# Patient Record
Sex: Female | Born: 1950 | Smoking: Former smoker
Health system: Southern US, Community
[De-identification: ages and names within clinical notes are randomized; demographics above are authoritative.]

## PROBLEM LIST (undated history)

## (undated) DIAGNOSIS — E039 Hypothyroidism, unspecified: Secondary | ICD-10-CM

## (undated) DIAGNOSIS — M1990 Unspecified osteoarthritis, unspecified site: Secondary | ICD-10-CM

## (undated) DIAGNOSIS — M069 Rheumatoid arthritis, unspecified: Secondary | ICD-10-CM

## (undated) DIAGNOSIS — H409 Unspecified glaucoma: Secondary | ICD-10-CM

## (undated) DIAGNOSIS — D509 Iron deficiency anemia, unspecified: Secondary | ICD-10-CM

## (undated) DIAGNOSIS — G4733 Obstructive sleep apnea (adult) (pediatric): Secondary | ICD-10-CM

## (undated) DIAGNOSIS — I10 Essential (primary) hypertension: Secondary | ICD-10-CM

## (undated) DIAGNOSIS — K589 Irritable bowel syndrome without diarrhea: Secondary | ICD-10-CM

## (undated) DIAGNOSIS — J45909 Unspecified asthma, uncomplicated: Secondary | ICD-10-CM

## (undated) DIAGNOSIS — Z9989 Dependence on other enabling machines and devices: Secondary | ICD-10-CM

## (undated) HISTORY — PX: LAPAROSCOPIC CHOLECYSTECTOMY: SUR755

## (undated) HISTORY — PX: ABDOMINAL HYSTERECTOMY: SHX81

---

## 1981-05-04 HISTORY — PX: THYROIDECTOMY, PARTIAL: SHX18

## 2002-05-04 HISTORY — PX: BREAST BIOPSY: SHX20

## 2017-05-04 HISTORY — PX: CATARACT EXTRACTION W/ INTRAOCULAR LENS  IMPLANT, BILATERAL: SHX1307

## 2018-06-22 ENCOUNTER — Encounter: Payer: Self-pay | Admitting: Student

## 2018-06-22 NOTE — H&P (Signed)
TOTAL KNEE ADMISSION H&P  Patient is being admitted for right total knee arthroplasty.  Subjective:  Chief Complaint:right knee pain.  HPI: Teresa Barajas, 68 y.o. female, has a history of pain and functional disability in the right knee due to arthritis and has failed non-surgical conservative treatments for greater than 12 weeks to includecorticosteriod injections, viscosupplementation injections and activity modification.  Onset of symptoms was gradual, starting 7 years ago with gradually worsening course since that time. The patient noted no past surgery on the right knee(s).  Patient currently rates pain in the right knee(s) at 10 out of 10 with activity. Patient has worsening of pain with activity and weight bearing and crepitus.  Patient has evidence of severe tri-compartmental bone-on-bone arthritis by imaging studies. There is no active infection.  There are no active problems to display for this patient.  History reviewed. No pertinent past medical history.  History reviewed. No pertinent surgical history.  No current facility-administered medications for this encounter.    No current outpatient medications on file.   Allergies not on file  Social History   Tobacco Use  . Smoking status: Not on file  Substance Use Topics  . Alcohol use: Not on file    History reviewed. No pertinent family history.   Review of Systems  Constitutional: Negative for chills and fever.  HENT: Negative for congestion, sore throat and tinnitus.   Eyes: Negative for double vision, photophobia and pain.  Respiratory: Negative for cough, shortness of breath and wheezing.   Cardiovascular: Negative for chest pain, palpitations and orthopnea.  Gastrointestinal: Negative for heartburn, nausea and vomiting.  Genitourinary: Negative for dysuria, frequency and urgency.  Musculoskeletal: Positive for joint pain.  Neurological: Negative for dizziness, weakness and headaches.    Objective:  Physical Exam    Well nourished and well developed.  General: Alert and oriented x3, cooperative and pleasant, no acute distress.  Head: normocephalic, atraumatic, neck supple.  Eyes: EOMI.  Respiratory: breath sounds clear in all fields, no wheezing, rales, or rhonchi. Cardiovascular: Regular rate and rhythm, no murmurs, gallops or rubs.  Abdomen: non-tender to palpation and soft, normoactive bowel sounds. Musculoskeletal:  Right Knee Exam: No effusion. No Swelling. Range of motion is 0-100 degrees with a firm endpoint.  Marked crepitus on range of motion of the knee.  Positive medial and lateral joint line tenderness.  Slight varus-valgus laxity.   Calves soft and nontender. Motor function intact in LE. Strength 5/5 LE bilaterally. Neuro: Distal pulses 2+. Sensation to light touch intact in LE.  Vital signs in last 24 hours: Blood pressure: 140/84 mmHg  Imaging Review Plain radiographs demonstrate severe degenerative joint disease of the right knee(s). The overall alignment isneutral. The bone quality appears to be adequate for age and reported activity level.      Assessment/Plan:  End stage arthritis, right knee   The patient history, physical examination, clinical judgment of the provider and imaging studies are consistent with end stage degenerative joint disease of the right knee(s) and total knee arthroplasty is deemed medically necessary. The treatment options including medical management, injection therapy arthroscopy and arthroplasty were discussed at length. The risks and benefits of total knee arthroplasty were presented and reviewed. The risks due to aseptic loosening, infection, stiffness, patella tracking problems, thromboembolic complications and other imponderables were discussed. The patient acknowledged the explanation, agreed to proceed with the plan and consent was signed. Patient is being admitted for inpatient treatment for surgery, pain control, PT, OT, prophylactic  antibiotics, VTE  prophylaxis, progressive ambulation and ADL's and discharge planning. The patient is planning to be discharged home.    Anticipated LOS equal to or greater than 2 midnights due to - Age 92 and older with one or more of the following:  - Obesity  - Expected need for hospital services (PT, OT, Nursing) required for safe  discharge  - Anticipated need for postoperative skilled nursing care or inpatient rehab  - Active co-morbidities: None OR   - Unanticipated findings during/Post Surgery: None  - Patient is a high risk of re-admission due to: None  Therapy Plans: outpatient therapy at [facility] Disposition: Home with husband and sister Planned DVT Prophylaxis: Aspirin 325 mg BID DME needed: Dan Humphreys, 3-in-1 PCP: Truddie Coco, FNP TXA: IV Allergies: Latex (rash), adhesives Anesthesia Concerns: None BMI: 38.3  - Patient was instructed on what medications to stop prior to surgery. - Follow-up visit in 2 weeks with Dr. Lequita Halt - Begin physical therapy following surgery - Pre-operative lab work as pre-surgical testing - Prescriptions will be provided in hospital at time of discharge  Arther Abbott, PA-C Orthopedic Surgery [facility] [place]

## 2018-07-04 ENCOUNTER — Encounter (HOSPITAL_COMMUNITY): Payer: Self-pay

## 2018-07-04 ENCOUNTER — Encounter (HOSPITAL_COMMUNITY)
Admission: RE | Admit: 2018-07-04 | Discharge: 2018-07-04 | Disposition: A | Discharge status: Home / Self Care | Payer: Medicare Other | Source: Ambulatory Visit | Attending: Orthopedic Surgery | Admitting: Orthopedic Surgery

## 2018-07-04 ENCOUNTER — Other Ambulatory Visit: Payer: Self-pay

## 2018-07-04 DIAGNOSIS — Z01812 Encounter for preprocedural laboratory examination: Secondary | ICD-10-CM | POA: Insufficient documentation

## 2018-07-04 HISTORY — DX: Dependence on other enabling machines and devices: Z99.89

## 2018-07-04 HISTORY — DX: Unspecified glaucoma: H40.9

## 2018-07-04 HISTORY — DX: Unspecified osteoarthritis, unspecified site: M19.90

## 2018-07-04 HISTORY — DX: Rheumatoid arthritis, unspecified: M06.9

## 2018-07-04 HISTORY — DX: Irritable bowel syndrome, unspecified: K58.9

## 2018-07-04 HISTORY — DX: Iron deficiency anemia, unspecified: D50.9

## 2018-07-04 HISTORY — DX: Hypothyroidism, unspecified: E03.9

## 2018-07-04 HISTORY — DX: Essential (primary) hypertension: I10

## 2018-07-04 HISTORY — DX: Obstructive sleep apnea (adult) (pediatric): G47.33

## 2018-07-04 HISTORY — DX: Unspecified asthma, uncomplicated: J45.909

## 2018-07-04 LAB — CBC
HCT: 37.6 % (ref 36.0–46.0)
HEMOGLOBIN: 12.4 g/dL (ref 12.0–15.0)
MCH: 32.7 pg (ref 26.0–34.0)
MCHC: 33 g/dL (ref 30.0–36.0)
MCV: 99.2 fL (ref 80.0–100.0)
Platelets: 230 10*3/uL (ref 150–400)
RBC: 3.79 MIL/uL — ABNORMAL LOW (ref 3.87–5.11)
RDW: 12.7 % (ref 11.5–15.5)
WBC: 5.6 10*3/uL (ref 4.0–10.5)
nRBC: 0 % (ref 0.0–0.2)

## 2018-07-04 LAB — COMPREHENSIVE METABOLIC PANEL
ALT: 19 U/L (ref 0–44)
AST: 22 U/L (ref 15–41)
Albumin: 4.4 g/dL (ref 3.5–5.0)
Alkaline Phosphatase: 53 U/L (ref 38–126)
Anion gap: 9 (ref 5–15)
BUN: 16 mg/dL (ref 8–23)
CO2: 27 mmol/L (ref 22–32)
Calcium: 9.1 mg/dL (ref 8.9–10.3)
Chloride: 103 mmol/L (ref 98–111)
Creatinine, Ser: 0.6 mg/dL (ref 0.44–1.00)
GFR calc Af Amer: >60 mL/min (ref >60)
GFR calc non Af Amer: >60 mL/min (ref >60)
Glucose, Bld: 103 mg/dL — ABNORMAL HIGH (ref 70–99)
Potassium: 3.6 mmol/L (ref 3.5–5.1)
Sodium: 139 mmol/L (ref 135–145)
Total Bilirubin: 0.4 mg/dL (ref 0.3–1.2)
Total Protein: 7.2 g/dL (ref 6.5–8.1)

## 2018-07-04 LAB — PROTIME-INR
INR: 1 (ref 0.8–1.2)
Prothrombin Time: 12.6 seconds (ref 11.4–15.2)

## 2018-07-04 LAB — ABO/RH: ABO/RH(D): A POS

## 2018-07-04 LAB — SURGICAL PCR SCREEN
MRSA, PCR: NEGATIVE
Staphylococcus aureus: NEGATIVE

## 2018-07-04 LAB — APTT: APTT: 48 s — AB (ref 24–36)

## 2018-07-04 NOTE — Progress Notes (Addendum)
Pt PCP surgical clearance , karen mcclure FNP, dated 06-23-2018 with chart.  Also, had current EKG dated 06-23-2018 with chart.   Per pt last dose plaquenil 06-26-2018 and last cimzia infusion 06-27-2018.  ADDENDUM:  Called and lvm for kelly , or scheduler for dr Lequita Halt, requested pt's pulmonologist clearance faxed.  Chart given to anesthesia for review, Jodell Cipro PA.

## 2018-07-04 NOTE — Patient Instructions (Addendum)
Teresa Barajas  07/04/2018   Your procedure is scheduled on:  07-11-2018     Report to [facility] Main  Entrance,  Report to admitting at  8:45 AM    Call this number if you have problems the morning of surgery [phone]       Remember: Do not eat food or drink liquids :After Midnight.  This includes no water, candy, gum, mints.  BRUSH YOUR TEETH MORNING OF SURGERY AND RINSE YOUR MOUTH OUT         Take these medicines the morning of surgery with A SIP OF WATER:  Duloxetine (cymbalta),  Gabapentin,  Levothyroxine (synthroid),  Dicyclomine (bentyl), hydrocodone if needed   and if need Proventil inhaler and bring inhaler with you day of surgery.                                   You may not have any metal on your body including hair pins and               piercings  Do not wear jewelry, make-up, lotions, powders or perfumes, deodorant              Do not wear nail polish.  Do not shave  48 hours prior to surgery.                 Do not bring valuables to the hospital. [facility] IS NOT             RESPONSIBLE   FOR VALUABLES.  Contacts, dentures or bridgework may not be worn into surgery.  Leave suitcase in the car. After surgery it may be brought to your room.   _____________________________________________________________________           [facility] - Preparing for Surgery Before surgery, you can play an important role.  Because skin is not sterile, your skin needs to be as free of germs as possible.  You can reduce the number of germs on your skin by washing with CHG (chlorahexidine gluconate) soap before surgery.  CHG is an antiseptic cleaner which kills germs and bonds with the skin to continue killing germs even after washing. Please DO NOT use if you have an allergy to CHG or antibacterial soaps.  If your skin becomes reddened/irritated stop using the CHG and inform your nurse when you arrive at Short Stay. Do not shave (including legs  and underarms) for at least 48 hours prior to the first CHG shower.  You may shave your face/neck. Please follow these instructions carefully:  1.  Shower with CHG Soap the night before surgery and the  morning of Surgery.  2.  If you choose to wash your hair, wash your hair first as usual with your  normal  shampoo.  3.  After you shampoo, rinse your hair and body thoroughly to remove the  shampoo.                            4.  Use CHG as you would any other liquid soap.  You can apply chg directly  to the skin and wash                       Gently with a scrungie or  clean washcloth.  5.  Apply the CHG Soap to your body ONLY FROM THE NECK DOWN.   Do not use on face/ open                           Wound or open sores. Avoid contact with eyes, ears mouth and genitals (private parts).                       Wash face,  Genitals (private parts) with your normal soap.             6.  Wash thoroughly, paying special attention to the area where your surgery  will be performed.  7.  Thoroughly rinse your body with warm water from the neck down.  8.  DO NOT shower/wash with your normal soap after using and rinsing off  the CHG Soap.             9.  Pat yourself dry with a clean towel.            10.  Wear clean pajamas.            11.  Place clean sheets on your bed the night of your first shower and do not  sleep with pets. Day of Surgery : Do not apply any lotions/deodorants the morning of surgery.  Please wear clean clothes to the hospital/surgery center.  FAILURE TO FOLLOW THESE INSTRUCTIONS MAY RESULT IN THE CANCELLATION OF YOUR SURGERY PATIENT SIGNATURE_________________________________  NURSE SIGNATURE__________________________________  ________________________________________________________________________   Rogelia MireIncentive Spirometer  An incentive spirometer is a tool that can help keep your lungs clear and active. This tool measures how well you are filling your lungs with each breath.  Taking long deep breaths may help reverse or decrease the chance of developing breathing (pulmonary) problems (especially infection) following:  A long period of time when you are unable to move or be active. BEFORE THE PROCEDURE   If the spirometer includes an indicator to show your best effort, your nurse or respiratory therapist will set it to a desired goal.  If possible, sit up straight or lean slightly forward. Try not to slouch.  Hold the incentive spirometer in an upright position. INSTRUCTIONS FOR USE  1. Sit on the edge of your bed if possible, or sit up as far as you can in bed or on a chair. 2. Hold the incentive spirometer in an upright position. 3. Breathe out normally. 4. Place the mouthpiece in your mouth and seal your lips tightly around it. 5. Breathe in slowly and as deeply as possible, raising the piston or the ball toward the top of the column. 6. Hold your breath for 3-5 seconds or for as long as possible. Allow the piston or ball to fall to the bottom of the column. 7. Remove the mouthpiece from your mouth and breathe out normally. 8. Rest for a few seconds and repeat Steps 1 through 7 at least 10 times every 1-2 hours when you are awake. Take your time and take a few normal breaths between deep breaths. 9. The spirometer may include an indicator to show your best effort. Use the indicator as a goal to work toward during each repetition. 10. After each set of 10 deep breaths, practice coughing to be sure your lungs are clear. If you have an incision (the cut made at the time of surgery), support your incision when coughing by placing a  pillow or rolled up towels firmly against it. Once you are able to get out of bed, walk around indoors and cough well. You may stop using the incentive spirometer when instructed by your caregiver.  RISKS AND COMPLICATIONS  Take your time so you do not get dizzy or light-headed.  If you are in pain, you may need to take or ask for pain  medication before doing incentive spirometry. It is harder to take a deep breath if you are having pain. AFTER USE  Rest and breathe slowly and easily.  It can be helpful to keep track of a log of your progress. Your caregiver can provide you with a simple table to help with this. If you are using the spirometer at home, follow these instructions: [place] IF:   You are having difficultly using the spirometer.  You have trouble using the spirometer as often as instructed.  Your pain medication is not giving enough relief while using the spirometer.  You develop fever of 100.5 F (38.1 C) or higher. SEEK IMMEDIATE MEDICAL CARE IF:   You cough up bloody sputum that had not been present before.  You develop fever of 102 F (38.9 C) or greater.  You develop worsening pain at or near the incision site. MAKE SURE YOU:   Understand these instructions.  Will watch your condition.  Will get help right away if you are not doing well or get worse. Document Released: 08/31/2006 Document Revised: 07/13/2011 Document Reviewed: 11/01/2006 Exit[employer] Patient Information 2014 [employer], [place].   ________________________________________________________________________  WHAT IS A BLOOD TRANSFUSION? Blood Transfusion Information  A transfusion is the replacement of blood or some of its parts. Blood is made up of multiple cells which provide different functions.  Red blood cells carry oxygen and are used for blood loss replacement.  White blood cells fight against infection.  Platelets control bleeding.  Plasma helps clot blood.  Other blood products are available for specialized needs, such as hemophilia or other clotting disorders. BEFORE THE TRANSFUSION  Who gives blood for transfusions?   Healthy volunteers who are fully evaluated to make sure their blood is safe. This is blood bank blood. Transfusion therapy is the safest it has ever been in the practice of medicine.  Before blood is taken from a donor, a complete history is taken to make sure that person has no history of diseases nor engages in risky social behavior (examples are intravenous drug use or sexual activity with multiple partners). The donor's travel history is screened to minimize risk of transmitting infections, such as malaria. The donated blood is tested for signs of infectious diseases, such as HIV and hepatitis. The blood is then tested to be sure it is compatible with you in order to minimize the chance of a transfusion reaction. If you or a relative donates blood, this is often done in anticipation of surgery and is not appropriate for emergency situations. It takes many days to process the donated blood. RISKS AND COMPLICATIONS Although transfusion therapy is very safe and saves many lives, the main dangers of transfusion include:   Getting an infectious disease.  Developing a transfusion reaction. This is an allergic reaction to something in the blood you were given. Every precaution is taken to prevent this. The decision to have a blood transfusion has been considered carefully by your caregiver before blood is given. Blood is not given unless the benefits outweigh the risks. AFTER THE TRANSFUSION  Right after receiving a blood transfusion, you  will usually feel much better and more energetic. This is especially true if your red blood cells have gotten low (anemic). The transfusion raises the level of the red blood cells which carry oxygen, and this usually causes an energy increase.  The nurse administering the transfusion will monitor you carefully for complications. HOME CARE INSTRUCTIONS  No special instructions are needed after a transfusion. You may find your energy is better. Speak with your caregiver about any limitations on activity for underlying diseases you may have. SEEK MEDICAL CARE IF:   Your condition is not improving after your transfusion.  You develop redness or  irritation at the intravenous (IV) site. SEEK IMMEDIATE MEDICAL CARE IF:  Any of the following symptoms occur over the next 12 hours:  Shaking chills.  You have a temperature by mouth above 102 F (38.9 C), not controlled by medicine.  Chest, back, or muscle pain.  People around you feel you are not acting correctly or are confused.  Shortness of breath or difficulty breathing.  Dizziness and fainting.  You get a rash or develop hives.  You have a decrease in urine output.  Your urine turns a dark color or changes to pink, red, or brown. Any of the following symptoms occur over the next 10 days:  You have a temperature by mouth above 102 F (38.9 C), not controlled by medicine.  Shortness of breath.  Weakness after normal activity.  The white part of the eye turns yellow (jaundice).  You have a decrease in the amount of urine or are urinating less often.  Your urine turns a dark color or changes to pink, red, or brown. Document Released: 04/17/2000 Document Revised: 07/13/2011 Document Reviewed: 12/05/2007 [facility] Patient Information 2014 [place], [place].  _______________________________________________________________________

## 2018-07-05 ENCOUNTER — Encounter (HOSPITAL_COMMUNITY): Payer: Self-pay

## 2018-07-07 NOTE — Progress Notes (Signed)
Anesthesia Chart Review   Case:  [id number] Date/Time:  07/11/18 1135   Procedure:  TOTAL KNEE ARTHROPLASTY (Right )   Anesthesia type:  Choice   Pre-op diagnosis:  right knee osteoarthritis   Location:  [facility] ROOM 09 / [facility]   Surgeon:  Ollen Gross, MD      DISCUSSION: 68 yo former smoker (quit 07/04/83) with h/o HTN, hypothyroidism, RA, asthma, IDA, OSA on CPAP, right knee OA scheduled for above procedure 07/11/18 with Dr. Ollen Gross.   Clearance received from PCP 06/29/18 which states pt is low risk for surgical procedure (on chart).   Clearance received from pulmonology 06/30/18 which states pt is cleared for surgery with the following recommendations, will need CPAP post-op (on chart).  Pt was advised to stop plaquenil and cimzia prior to surgery, last dose of plaquenil 06/26/18, last dose of cimzia 06/27/18.  VS: BP (!) 158/79   Pulse (!) 59   Temp 37.1 C (Oral)   Resp 12   Ht 5\' 3"  (1.6 m)   Wt 92.1 kg   SpO2 100%   BMI 35.98 kg/m   PROVIDERS: Patient, No Pcp Per  Truddie Coco, NP is PCP   Horald Pollen, NP with pulmonology LABS: Labs reviewed: Acceptable for surgery. (all labs ordered are listed, but only abnormal results are displayed)  Labs Reviewed  APTT - Abnormal; Notable for the following components:      Result Value   aPTT 48 (*)    All other components within normal limits  CBC - Abnormal; Notable for the following components:   RBC 3.79 (*)    All other components within normal limits  COMPREHENSIVE METABOLIC PANEL - Abnormal; Notable for the following components:   Glucose, Bld 103 (*)    All other components within normal limits  SURGICAL PCR SCREEN  PROTIME-INR  TYPE AND SCREEN  ABO/RH     IMAGES:   EKG: 06/23/18 (on chart)  Rate 54 bpm Bradycardia   CV:  Past Medical History:  Diagnosis Date  . Asthma    pulmonologist-- dr Lucretia Field (in [place], [place])  . Glaucoma, both eyes    currently stable , no eye drops  . Hypertension    . Hypothyroidism   . IBS (irritable bowel syndrome)   . IDA (iron deficiency anemia)   . OA (osteoarthritis)    knees,   . OSA on CPAP   . RA (rheumatoid arthritis) (HCC)    schoraff--  methotrexate, gabapentin, folic acid, and cimzia infusion     Past Surgical History:  Procedure Laterality Date  . ABDOMINAL HYSTERECTOMY  1980s   ovaries remain  . BREAST BIOPSY Left 2004   benign  . CATARACT EXTRACTION W/ INTRAOCULAR LENS  IMPLANT, BILATERAL  2019  . CESAREAN SECTION  x2  last one 50  . LAPAROSCOPIC CHOLECYSTECTOMY  2003 approx.  . THYROIDECTOMY, PARTIAL Right 1983   goiter    MEDICATIONS: . amLODIPine-Valsartan-HCTZ 10-320-25 MG TABS  . aspirin EC 81 MG tablet  . Biotin 5 MG TABS  . calcium-vitamin D (SM CALCIUM 500/VITAMIN D3) 500-400 MG-UNIT tablet  . Certolizumab Pegol (CIMZIA [place])  . cetirizine (ZYRTEC) 10 MG tablet  . diclofenac sodium (VOLTAREN) 1 % GEL  . dicyclomine (BENTYL) 10 MG capsule  . DULoxetine (CYMBALTA) 60 MG capsule  . folic acid (FOLVITE) 1 MG tablet  . gabapentin (NEURONTIN) 800 MG tablet  . HYDROcodone-acetaminophen (NORCO) 10-325 MG tablet  . hydroxychloroquine (PLAQUENIL) 200 MG tablet  . levothyroxine (SYNTHROID, LEVOTHROID)  75 MCG tablet  . Methotrexate Sodium (METHOTREXATE, PF,) 50 MG/2ML injection  . montelukast (SINGULAIR) 10 MG tablet  . Multiple Vitamin (MULTIVITAMIN WITH MINERALS) TABS tablet  . NALTREXONE HCL PO  . Omega-3 Fatty Acids (FISH OIL) 1000 MG CAPS  . tiZANidine (ZANAFLEX) 4 MG tablet   No current facility-administered medications for this encounter.     Janey Genta [facility] Pre-Surgical Testing [phone] 07/07/18 3:57 PM

## 2018-07-07 NOTE — Anesthesia Preprocedure Evaluation (Addendum)
Anesthesia Evaluation  Patient identified by MRN, date of birth, ID band Patient awake    Reviewed: Allergy & Precautions, NPO status , Patient's Chart, lab work & pertinent test results  Airway Mallampati: II  TM Distance: >3 FB Neck ROM: Full    Dental no notable dental hx.    Pulmonary asthma , sleep apnea and Continuous Positive Airway Pressure Ventilation , former smoker,    Pulmonary exam normal breath sounds clear to auscultation       Cardiovascular hypertension, Pt. on medications Normal cardiovascular exam Rhythm:Regular Rate:Normal     Neuro/Psych negative neurological ROS  negative psych ROS   GI/Hepatic negative GI ROS, Neg liver ROS,   Endo/Other  negative endocrine ROS  Renal/GU negative Renal ROS  negative genitourinary   Musculoskeletal  (+) Arthritis , Rheumatoid disorders,    Abdominal   Peds negative pediatric ROS (+)  Hematology negative hematology ROS (+)   Anesthesia Other Findings   Reproductive/Obstetrics negative OB ROS                            Anesthesia Physical Anesthesia Plan  ASA: II  Anesthesia Plan: Spinal   Post-op Pain Management:  [facility] for Post-op pain   Induction:   PONV Risk Score and Plan: 2 and Treatment may vary due to age or medical condition and Propofol infusion  Airway Management Planned: Simple Face Mask  Additional Equipment:   Intra-op Plan:   Post-operative Plan:   Informed Consent: I have reviewed the patients History and Physical, chart, labs and discussed the procedure including the risks, benefits and alternatives for the proposed anesthesia with the patient or authorized representative who has indicated his/her understanding and acceptance.     Dental advisory given  Plan Discussed with: CRNA  Anesthesia Plan Comments: (See PAT note 07/04/18, Jodell Cipro, PA-C)       Anesthesia Quick Evaluation

## 2018-07-10 MED ORDER — BUPIVACAINE LIPOSOME 1.3 % IJ SUSP
20.0000 mL | INTRAMUSCULAR | Status: DC
  Filled 2018-07-10 (×2): qty 20

## 2018-07-11 ENCOUNTER — Inpatient Hospital Stay (HOSPITAL_COMMUNITY): Payer: Medicare Other | Admitting: Anesthesiology

## 2018-07-11 ENCOUNTER — Encounter (HOSPITAL_COMMUNITY): Payer: Self-pay | Admitting: *Deleted

## 2018-07-11 ENCOUNTER — Inpatient Hospital Stay (HOSPITAL_COMMUNITY): Payer: Medicare Other | Admitting: Physician Assistant

## 2018-07-11 ENCOUNTER — Inpatient Hospital Stay (HOSPITAL_COMMUNITY)
Admission: RE | Admit: 2018-07-11 | Discharge: 2018-07-13 | DRG: 470 | Disposition: A | Discharge status: Home / Self Care | Payer: Medicare Other | Attending: Orthopedic Surgery | Admitting: Orthopedic Surgery

## 2018-07-11 ENCOUNTER — Encounter (HOSPITAL_COMMUNITY)
Admission: RE | Disposition: A | Discharge status: Home / Self Care | Payer: Self-pay | Source: Home / Self Care | Attending: Orthopedic Surgery

## 2018-07-11 ENCOUNTER — Other Ambulatory Visit: Payer: Self-pay

## 2018-07-11 DIAGNOSIS — G4733 Obstructive sleep apnea (adult) (pediatric): Secondary | ICD-10-CM | POA: Diagnosis present

## 2018-07-11 DIAGNOSIS — I10 Essential (primary) hypertension: Secondary | ICD-10-CM | POA: Diagnosis present

## 2018-07-11 DIAGNOSIS — M179 Osteoarthritis of knee, unspecified: Secondary | ICD-10-CM | POA: Diagnosis present

## 2018-07-11 DIAGNOSIS — D509 Iron deficiency anemia, unspecified: Secondary | ICD-10-CM | POA: Diagnosis present

## 2018-07-11 DIAGNOSIS — Z6835 Body mass index (BMI) 35.0-35.9, adult: Secondary | ICD-10-CM

## 2018-07-11 DIAGNOSIS — E039 Hypothyroidism, unspecified: Secondary | ICD-10-CM | POA: Diagnosis present

## 2018-07-11 DIAGNOSIS — E669 Obesity, unspecified: Secondary | ICD-10-CM | POA: Diagnosis present

## 2018-07-11 DIAGNOSIS — M25571 Pain in right ankle and joints of right foot: Secondary | ICD-10-CM | POA: Diagnosis present

## 2018-07-11 DIAGNOSIS — M7730 Calcaneal spur, unspecified foot: Secondary | ICD-10-CM | POA: Diagnosis present

## 2018-07-11 DIAGNOSIS — M1710 Unilateral primary osteoarthritis, unspecified knee: Secondary | ICD-10-CM

## 2018-07-11 DIAGNOSIS — M1711 Unilateral primary osteoarthritis, right knee: Secondary | ICD-10-CM | POA: Diagnosis present

## 2018-07-11 DIAGNOSIS — J45909 Unspecified asthma, uncomplicated: Secondary | ICD-10-CM | POA: Diagnosis present

## 2018-07-11 DIAGNOSIS — R52 Pain, unspecified: Secondary | ICD-10-CM

## 2018-07-11 DIAGNOSIS — Z96649 Presence of unspecified artificial hip joint: Secondary | ICD-10-CM

## 2018-07-11 DIAGNOSIS — M069 Rheumatoid arthritis, unspecified: Secondary | ICD-10-CM | POA: Diagnosis present

## 2018-07-11 HISTORY — PX: TOTAL KNEE ARTHROPLASTY: SHX125

## 2018-07-11 LAB — TYPE AND SCREEN
ABO/RH(D): A POS
Antibody Screen: NEGATIVE

## 2018-07-11 SURGERY — ARTHROPLASTY, KNEE, TOTAL
Anesthesia: Spinal | Site: Knee | Laterality: Right

## 2018-07-11 MED ORDER — OXYCODONE HCL 5 MG PO TABS
10.0000 mg | ORAL_TABLET | ORAL | Status: DC | PRN
  Administered 2018-07-11 (×2): 10 mg via ORAL
  Administered 2018-07-12 (×2): 15 mg via ORAL
  Filled 2018-07-11: qty 2
  Filled 2018-07-11 (×2): qty 3

## 2018-07-11 MED ORDER — POLYETHYLENE GLYCOL 3350 17 G PO PACK: 17.0000 g | PACK | Freq: Every day | ORAL | Status: DC | PRN

## 2018-07-11 MED ORDER — METOCLOPRAMIDE HCL 5 MG/ML IJ SOLN: 5.0000 mg | Freq: Three times a day (TID) | INTRAMUSCULAR | Status: DC | PRN

## 2018-07-11 MED ORDER — GABAPENTIN 400 MG PO CAPS
800.0000 mg | ORAL_CAPSULE | Freq: Two times a day (BID) | ORAL | Status: DC
  Administered 2018-07-12 – 2018-07-13 (×3): 800 mg via ORAL
  Filled 2018-07-11 (×3): qty 2

## 2018-07-11 MED ORDER — LIDOCAINE HCL (CARDIAC) PF 100 MG/5ML IV SOSY
PREFILLED_SYRINGE | INTRAVENOUS | Status: DC | PRN
  Administered 2018-07-11 (×2): 100 mg via INTRATRACHEAL

## 2018-07-11 MED ORDER — MENTHOL 3 MG MT LOZG: 1.0000 | LOZENGE | OROMUCOSAL | Status: DC | PRN

## 2018-07-11 MED ORDER — SODIUM CHLORIDE (PF) 0.9 % IJ SOLN
INTRAMUSCULAR | Status: AC
  Filled 2018-07-11: qty 50

## 2018-07-11 MED ORDER — METOCLOPRAMIDE HCL 5 MG/ML IJ SOLN: 10.0000 mg | Freq: Once | INTRAMUSCULAR | Status: DC | PRN

## 2018-07-11 MED ORDER — PHENYLEPHRINE 40 MCG/ML (10ML) SYRINGE FOR IV PUSH (FOR BLOOD PRESSURE SUPPORT)
PREFILLED_SYRINGE | INTRAVENOUS | Status: AC
  Filled 2018-07-11: qty 10

## 2018-07-11 MED ORDER — ASPIRIN EC 325 MG PO TBEC
325.0000 mg | DELAYED_RELEASE_TABLET | Freq: Two times a day (BID) | ORAL | Status: DC
  Administered 2018-07-12 – 2018-07-13 (×3): 325 mg via ORAL
  Filled 2018-07-11 (×3): qty 1

## 2018-07-11 MED ORDER — MORPHINE SULFATE (PF) 2 MG/ML IV SOLN
1.0000 mg | INTRAVENOUS | Status: DC | PRN
  Administered 2018-07-11: 2 mg via INTRAVENOUS
  Administered 2018-07-11: 1 mg via INTRAVENOUS
  Administered 2018-07-12 (×5): 2 mg via INTRAVENOUS
  Filled 2018-07-11 (×7): qty 1

## 2018-07-11 MED ORDER — METHOCARBAMOL 500 MG IVPB - SIMPLE MED
500.0000 mg | Freq: Four times a day (QID) | INTRAVENOUS | Status: DC | PRN
  Administered 2018-07-11: 500 mg via INTRAVENOUS
  Filled 2018-07-11: qty 50

## 2018-07-11 MED ORDER — EPHEDRINE SULFATE-NACL 50-0.9 MG/10ML-% IV SOSY
PREFILLED_SYRINGE | INTRAVENOUS | Status: DC | PRN
  Administered 2018-07-11 (×2): 10 mg via INTRAVENOUS

## 2018-07-11 MED ORDER — 0.9 % SODIUM CHLORIDE (POUR BTL) OPTIME
TOPICAL | Status: DC | PRN
  Administered 2018-07-11: 1000 mL

## 2018-07-11 MED ORDER — ONDANSETRON HCL 4 MG/2ML IJ SOLN
INTRAMUSCULAR | Status: AC
  Filled 2018-07-11: qty 2

## 2018-07-11 MED ORDER — ACETAMINOPHEN 500 MG PO TABS
1000.0000 mg | ORAL_TABLET | Freq: Four times a day (QID) | ORAL | Status: AC
  Administered 2018-07-11 – 2018-07-12 (×3): 1000 mg via ORAL
  Filled 2018-07-11 (×3): qty 2

## 2018-07-11 MED ORDER — TRANEXAMIC ACID-NACL 1000-0.7 MG/100ML-% IV SOLN
1000.0000 mg | INTRAVENOUS | Status: AC
  Administered 2018-07-11: 1000 mg via INTRAVENOUS
  Filled 2018-07-11: qty 100

## 2018-07-11 MED ORDER — DIPHENHYDRAMINE HCL 12.5 MG/5ML PO ELIX
12.5000 mg | ORAL_SOLUTION | ORAL | Status: DC | PRN
  Administered 2018-07-11 – 2018-07-12 (×2): 25 mg via ORAL
  Filled 2018-07-11 (×2): qty 10

## 2018-07-11 MED ORDER — CEFAZOLIN SODIUM-DEXTROSE 2-4 GM/100ML-% IV SOLN
2.0000 g | Freq: Four times a day (QID) | INTRAVENOUS | Status: AC
  Administered 2018-07-11 (×2): 2 g via INTRAVENOUS
  Filled 2018-07-11 (×2): qty 100

## 2018-07-11 MED ORDER — LIDOCAINE 2% (20 MG/ML) 5 ML SYRINGE
INTRAMUSCULAR | Status: AC
  Filled 2018-07-11: qty 5

## 2018-07-11 MED ORDER — BUPIVACAINE IN DEXTROSE 0.75-8.25 % IT SOLN
INTRATHECAL | Status: DC | PRN
  Administered 2018-07-11: 1.5 mL via INTRATHECAL

## 2018-07-11 MED ORDER — MIDAZOLAM HCL 2 MG/2ML IJ SOLN
1.0000 mg | INTRAMUSCULAR | Status: DC
  Filled 2018-07-11: qty 2

## 2018-07-11 MED ORDER — PROPOFOL 10 MG/ML IV BOLUS
INTRAVENOUS | Status: AC
  Filled 2018-07-11: qty 20

## 2018-07-11 MED ORDER — DICYCLOMINE HCL 10 MG PO CAPS
10.0000 mg | ORAL_CAPSULE | Freq: Two times a day (BID) | ORAL | Status: DC
  Administered 2018-07-12 – 2018-07-13 (×3): 10 mg via ORAL
  Filled 2018-07-11 (×3): qty 1

## 2018-07-11 MED ORDER — LORATADINE 10 MG PO TABS
10.0000 mg | ORAL_TABLET | Freq: Every day | ORAL | Status: DC
  Administered 2018-07-12 – 2018-07-13 (×2): 10 mg via ORAL
  Filled 2018-07-11 (×2): qty 1

## 2018-07-11 MED ORDER — LACTATED RINGERS IV SOLN
INTRAVENOUS | Status: DC
  Administered 2018-07-11 (×2): via INTRAVENOUS

## 2018-07-11 MED ORDER — METOCLOPRAMIDE HCL 5 MG PO TABS: 5.0000 mg | ORAL_TABLET | Freq: Three times a day (TID) | ORAL | Status: DC | PRN

## 2018-07-11 MED ORDER — DEXAMETHASONE SODIUM PHOSPHATE 10 MG/ML IJ SOLN
INTRAMUSCULAR | Status: AC
  Filled 2018-07-11: qty 1

## 2018-07-11 MED ORDER — ROPIVACAINE HCL 7.5 MG/ML IJ SOLN
INTRAMUSCULAR | Status: DC | PRN
  Administered 2018-07-11: 20 mL via PERINEURAL

## 2018-07-11 MED ORDER — SODIUM CHLORIDE 0.9 % IV SOLN
INTRAVENOUS | Status: DC | PRN
  Administered 2018-07-11: 40 ug/min via INTRAVENOUS

## 2018-07-11 MED ORDER — DEXAMETHASONE SODIUM PHOSPHATE 10 MG/ML IJ SOLN
10.0000 mg | Freq: Once | INTRAMUSCULAR | Status: DC
  Filled 2018-07-11: qty 1

## 2018-07-11 MED ORDER — MONTELUKAST SODIUM 10 MG PO TABS
10.0000 mg | ORAL_TABLET | Freq: Every day | ORAL | Status: DC
  Administered 2018-07-11 – 2018-07-12 (×2): 10 mg via ORAL
  Filled 2018-07-11 (×2): qty 1

## 2018-07-11 MED ORDER — MEPERIDINE HCL 50 MG/ML IJ SOLN: 6.2500 mg | INTRAMUSCULAR | Status: DC | PRN

## 2018-07-11 MED ORDER — PROPOFOL 10 MG/ML IV BOLUS
INTRAVENOUS | Status: DC | PRN
  Administered 2018-07-11 (×4): 20 mg via INTRAVENOUS
  Administered 2018-07-11: 30 mg via INTRAVENOUS
  Administered 2018-07-11: 20 mg via INTRAVENOUS

## 2018-07-11 MED ORDER — FENTANYL CITRATE (PF) 100 MCG/2ML IJ SOLN
50.0000 ug | INTRAMUSCULAR | Status: DC
  Administered 2018-07-11: 100 ug via INTRAVENOUS
  Filled 2018-07-11: qty 2

## 2018-07-11 MED ORDER — ONDANSETRON HCL 4 MG/2ML IJ SOLN: 4.0000 mg | Freq: Four times a day (QID) | INTRAMUSCULAR | Status: DC | PRN

## 2018-07-11 MED ORDER — PHENYLEPHRINE HCL 10 MG/ML IJ SOLN
INTRAMUSCULAR | Status: AC
  Filled 2018-07-11: qty 1

## 2018-07-11 MED ORDER — FENTANYL CITRATE (PF) 100 MCG/2ML IJ SOLN
INTRAMUSCULAR | Status: AC
  Filled 2018-07-11: qty 2

## 2018-07-11 MED ORDER — DULOXETINE HCL 60 MG PO CPEP
60.0000 mg | ORAL_CAPSULE | Freq: Two times a day (BID) | ORAL | Status: DC
  Administered 2018-07-11 – 2018-07-13 (×4): 60 mg via ORAL
  Filled 2018-07-11 (×4): qty 1

## 2018-07-11 MED ORDER — PHENOL 1.4 % MT LIQD
1.0000 | OROMUCOSAL | Status: DC | PRN
  Filled 2018-07-11: qty 177

## 2018-07-11 MED ORDER — STERILE WATER FOR IRRIGATION IR SOLN
Status: DC | PRN
  Administered 2018-07-11: 2000 mL

## 2018-07-11 MED ORDER — CEFAZOLIN SODIUM-DEXTROSE 2-4 GM/100ML-% IV SOLN
2.0000 g | INTRAVENOUS | Status: AC
  Administered 2018-07-11: 2 g via INTRAVENOUS
  Filled 2018-07-11: qty 100

## 2018-07-11 MED ORDER — DEXAMETHASONE SODIUM PHOSPHATE 10 MG/ML IJ SOLN
8.0000 mg | Freq: Once | INTRAMUSCULAR | Status: AC
  Administered 2018-07-11: 10 mg via INTRAVENOUS

## 2018-07-11 MED ORDER — ONDANSETRON HCL 4 MG/2ML IJ SOLN
INTRAMUSCULAR | Status: DC | PRN
  Administered 2018-07-11: 4 mg via INTRAVENOUS

## 2018-07-11 MED ORDER — TIZANIDINE HCL 4 MG PO TABS
4.0000 mg | ORAL_TABLET | Freq: Three times a day (TID) | ORAL | Status: DC | PRN
  Administered 2018-07-12 (×2): 4 mg via ORAL
  Filled 2018-07-11 (×2): qty 1

## 2018-07-11 MED ORDER — METHOCARBAMOL 500 MG IVPB - SIMPLE MED
INTRAVENOUS | Status: AC
  Filled 2018-07-11: qty 50

## 2018-07-11 MED ORDER — CHLORHEXIDINE GLUCONATE 4 % EX LIQD: 60.0000 mL | Freq: Once | CUTANEOUS | Status: DC

## 2018-07-11 MED ORDER — SODIUM CHLORIDE (PF) 0.9 % IJ SOLN
INTRAMUSCULAR | Status: AC
  Filled 2018-07-11: qty 10

## 2018-07-11 MED ORDER — FLEET ENEMA 7-19 GM/118ML RE ENEM: 1.0000 | ENEMA | Freq: Once | RECTAL | Status: DC | PRN

## 2018-07-11 MED ORDER — TRANEXAMIC ACID-NACL 1000-0.7 MG/100ML-% IV SOLN
1000.0000 mg | Freq: Once | INTRAVENOUS | Status: AC
  Administered 2018-07-11: 1000 mg via INTRAVENOUS
  Filled 2018-07-11: qty 100

## 2018-07-11 MED ORDER — LACTATED RINGERS IV SOLN: INTRAVENOUS | Status: DC

## 2018-07-11 MED ORDER — OXYCODONE HCL 5 MG PO TABS
5.0000 mg | ORAL_TABLET | ORAL | Status: DC | PRN
  Filled 2018-07-11: qty 2

## 2018-07-11 MED ORDER — SODIUM CHLORIDE (PF) 0.9 % IJ SOLN
INTRAMUSCULAR | Status: DC | PRN
  Administered 2018-07-11: 60 mL

## 2018-07-11 MED ORDER — LEVOTHYROXINE SODIUM 75 MCG PO TABS
75.0000 ug | ORAL_TABLET | Freq: Every day | ORAL | Status: DC
  Administered 2018-07-12 – 2018-07-13 (×2): 75 ug via ORAL
  Filled 2018-07-11 (×2): qty 1

## 2018-07-11 MED ORDER — EPHEDRINE 5 MG/ML INJ
INTRAVENOUS | Status: AC
  Filled 2018-07-11: qty 10

## 2018-07-11 MED ORDER — ONDANSETRON HCL 4 MG PO TABS: 4.0000 mg | ORAL_TABLET | Freq: Four times a day (QID) | ORAL | Status: DC | PRN

## 2018-07-11 MED ORDER — BISACODYL 10 MG RE SUPP: 10.0000 mg | Freq: Every day | RECTAL | Status: DC | PRN

## 2018-07-11 MED ORDER — ACETAMINOPHEN 10 MG/ML IV SOLN
1000.0000 mg | Freq: Four times a day (QID) | INTRAVENOUS | Status: DC
  Administered 2018-07-11: 1000 mg via INTRAVENOUS
  Filled 2018-07-11: qty 100

## 2018-07-11 MED ORDER — DOCUSATE SODIUM 100 MG PO CAPS
100.0000 mg | ORAL_CAPSULE | Freq: Two times a day (BID) | ORAL | Status: DC
  Administered 2018-07-11 – 2018-07-13 (×4): 100 mg via ORAL
  Filled 2018-07-11 (×4): qty 1

## 2018-07-11 MED ORDER — PROPOFOL 500 MG/50ML IV EMUL
INTRAVENOUS | Status: DC | PRN
  Administered 2018-07-11: 50 ug/kg/min via INTRAVENOUS

## 2018-07-11 MED ORDER — METHOCARBAMOL 500 MG PO TABS
500.0000 mg | ORAL_TABLET | Freq: Four times a day (QID) | ORAL | Status: DC | PRN
  Administered 2018-07-11 – 2018-07-13 (×5): 500 mg via ORAL
  Filled 2018-07-11 (×5): qty 1

## 2018-07-11 MED ORDER — SODIUM CHLORIDE 0.9 % IR SOLN
Status: DC | PRN
  Administered 2018-07-11: 1000 mL

## 2018-07-11 MED ORDER — BUPIVACAINE LIPOSOME 1.3 % IJ SUSP
INTRAMUSCULAR | Status: DC | PRN
  Administered 2018-07-11: 20 mL

## 2018-07-11 MED ORDER — IRBESARTAN 150 MG PO TABS
300.0000 mg | ORAL_TABLET | Freq: Every day | ORAL | Status: DC
  Administered 2018-07-11 – 2018-07-13 (×3): 300 mg via ORAL
  Filled 2018-07-11 (×3): qty 2

## 2018-07-11 MED ORDER — FENTANYL CITRATE (PF) 100 MCG/2ML IJ SOLN
25.0000 ug | INTRAMUSCULAR | Status: DC | PRN
  Administered 2018-07-11: 50 ug via INTRAVENOUS

## 2018-07-11 MED ORDER — AMLODIPINE BESYLATE 10 MG PO TABS
10.0000 mg | ORAL_TABLET | Freq: Every day | ORAL | Status: DC
  Administered 2018-07-11 – 2018-07-13 (×3): 10 mg via ORAL
  Filled 2018-07-11 (×3): qty 1

## 2018-07-11 MED ORDER — SODIUM CHLORIDE 0.9 % IV SOLN
INTRAVENOUS | Status: DC
  Administered 2018-07-11 – 2018-07-12 (×2): via INTRAVENOUS

## 2018-07-11 SURGICAL SUPPLY — 61 items
ATTUNE MED DOME PAT 32 KNEE (Knees) ×2 IMPLANT
ATTUNE MED DOME PAT 32MM KNEE (Knees) ×1 IMPLANT
ATTUNE PS FEM RT SZ 4 CEM KNEE (Femur) ×3 IMPLANT
ATTUNE PSRP INSR SZ4 10 KNEE (Insert) IMPLANT
ATTUNE PSRP INSR SZ4 10MM KNEE (Insert) IMPLANT
BAG ZIPLOCK 12X15 (MISCELLANEOUS) ×3 IMPLANT
BANDAGE ACE 6X5 VEL STRL LF (GAUZE/BANDAGES/DRESSINGS) ×3 IMPLANT
BANDAGE ELASTIC 6 VELCRO ST LF (GAUZE/BANDAGES/DRESSINGS) ×3 IMPLANT
BASEPLATE TIBIAL ROTATING SZ 4 (Knees) ×3 IMPLANT
BLADE SAG 18X100X1.27 (BLADE) ×3 IMPLANT
BLADE SAW SGTL 11.0X1.19X90.0M (BLADE) ×3 IMPLANT
BLADE SURG SZ10 CARB STEEL (BLADE) ×6 IMPLANT
BOWL SMART MIX CTS (DISPOSABLE) ×3 IMPLANT
CEMENT HV SMART SET (Cement) ×6 IMPLANT
CLOSURE WOUND 1/2 X4 (GAUZE/BANDAGES/DRESSINGS) ×2
COVER SURGICAL LIGHT HANDLE (MISCELLANEOUS) ×3 IMPLANT
COVER WAND RF STERILE (DRAPES) IMPLANT
CUFF TOURN SGL QUICK 34 (TOURNIQUET CUFF) ×2
CUFF TRNQT CYL 34X4.125X (TOURNIQUET CUFF) ×1 IMPLANT
DECANTER SPIKE VIAL GLASS SM (MISCELLANEOUS) ×3 IMPLANT
DRAPE U-SHAPE 47X51 STRL (DRAPES) ×3 IMPLANT
DRSG ADAPTIC 3X8 NADH LF (GAUZE/BANDAGES/DRESSINGS) ×3 IMPLANT
DRSG PAD ABDOMINAL 8X10 ST (GAUZE/BANDAGES/DRESSINGS) ×3 IMPLANT
DURAPREP 26ML APPLICATOR (WOUND CARE) ×3 IMPLANT
ELECT REM PT RETURN 15FT ADLT (MISCELLANEOUS) ×3 IMPLANT
EVACUATOR 1/8 PVC DRAIN (DRAIN) ×3 IMPLANT
GAUZE SPONGE 4X4 12PLY STRL (GAUZE/BANDAGES/DRESSINGS) ×3 IMPLANT
GLOVE BIO SURGEON STRL SZ8 (GLOVE) ×3 IMPLANT
GLOVE BIOGEL PI IND STRL 6.5 (GLOVE) ×1 IMPLANT
GLOVE BIOGEL PI IND STRL 7.0 (GLOVE) ×1 IMPLANT
GLOVE BIOGEL PI IND STRL 8 (GLOVE) ×1 IMPLANT
GLOVE BIOGEL PI INDICATOR 6.5 (GLOVE) ×2
GLOVE BIOGEL PI INDICATOR 7.0 (GLOVE) ×2
GLOVE BIOGEL PI INDICATOR 8 (GLOVE) ×2
GOWN STRL REUS W/TWL LRG LVL3 (GOWN DISPOSABLE) ×9 IMPLANT
HANDPIECE INTERPULSE COAX TIP (DISPOSABLE) ×2
HOLDER FOLEY CATH W/STRAP (MISCELLANEOUS) ×3 IMPLANT
IMMOBILIZER KNEE 20 (SOFTGOODS) ×6 IMPLANT
IMMOBILIZER KNEE 20 THIGH 36 (SOFTGOODS) ×1 IMPLANT
INSERT KNEE ATTUNE SZ4 14MM (Insert) ×3 IMPLANT
KIT TURNOVER KIT A (KITS) ×3 IMPLANT
MANIFOLD NEPTUNE II (INSTRUMENTS) ×3 IMPLANT
NS IRRIG 1000ML POUR BTL (IV SOLUTION) ×3 IMPLANT
PACK TOTAL KNEE CUSTOM (KITS) ×3 IMPLANT
PAD ABD 7.5X8 STRL (GAUZE/BANDAGES/DRESSINGS) ×3 IMPLANT
PADDING CAST COTTON 6X4 STRL (CAST SUPPLIES) ×6 IMPLANT
PIN STEINMAN FIXATION KNEE (PIN) ×3 IMPLANT
PIN THREADED HEADED SIGMA (PIN) ×3 IMPLANT
PROTECTOR NERVE ULNAR (MISCELLANEOUS) ×3 IMPLANT
SET HNDPC FAN SPRY TIP SCT (DISPOSABLE) ×1 IMPLANT
STRIP CLOSURE SKIN 1/2X4 (GAUZE/BANDAGES/DRESSINGS) ×4 IMPLANT
SUT MNCRL AB 4-0 PS2 18 (SUTURE) ×3 IMPLANT
SUT STRATAFIX 0 PDS 27 VIOLET (SUTURE) ×3
SUT VIC AB 2-0 CT1 27 (SUTURE) ×6
SUT VIC AB 2-0 CT1 TAPERPNT 27 (SUTURE) ×3 IMPLANT
SUTURE STRATFX 0 PDS 27 VIOLET (SUTURE) ×1 IMPLANT
TRAY FOLEY BAG SILVER LF 16FR (CATHETERS) ×3 IMPLANT
TRAY FOLEY MTR SLVR 16FR STAT (SET/KITS/TRAYS/PACK) ×3 IMPLANT
WATER STERILE IRR 1000ML POUR (IV SOLUTION) ×6 IMPLANT
WRAP KNEE MAXI GEL POST OP (GAUZE/BANDAGES/DRESSINGS) ×3 IMPLANT
YANKAUER SUCT BULB TIP 10FT TU (MISCELLANEOUS) ×3 IMPLANT

## 2018-07-11 NOTE — Transfer of Care (Signed)
Immediate Anesthesia Transfer of Care Note  Patient: Teresa Barajas  Procedure(s) Performed: TOTAL KNEE ARTHROPLASTY (Right Knee)  Patient Location: PACU  Anesthesia Type:Spinal  Level of Consciousness: awake  Airway & Oxygen Therapy: Patient Spontanous Breathing and Patient connected to face mask oxygen  Post-op Assessment: Report given to RN and Post -op Vital signs reviewed and stable  Post vital signs: Reviewed and stable  Last Vitals:  Vitals Value Taken Time  BP    Temp    Pulse 58 07/11/2018  1:13 PM  Resp 17 07/11/2018  1:13 PM  SpO2 100 % 07/11/2018  1:13 PM  Vitals shown include unvalidated device data.  Last Pain:  Vitals:   07/11/18 1054  TempSrc:   PainSc: 0-No pain      Patients Stated Pain Goal: 4 (07/11/18 1038)  Complications: No apparent anesthesia complications

## 2018-07-11 NOTE — Progress Notes (Signed)
AssistedDr. Carignan with right, ultrasound guided, adductor canal block. Side rails up, monitors on throughout procedure. See vital signs in flow sheet. Tolerated Procedure well.  

## 2018-07-11 NOTE — Op Note (Signed)
OPERATIVE REPORT-TOTAL KNEE ARTHROPLASTY   Pre-operative diagnosis- Osteoarthritis  Right knee(s)  Post-operative diagnosis- Osteoarthritis Right knee(s)  Procedure-  Right  Total Knee Arthroplasty  Surgeon- Gus Rankin. Balthazar Dooly, MD  Assistant- Arther Abbott, PA-C   Anesthesia-  Adductor canal block and spinal  EBL- 25 ml   Drains Hemovac  Tourniquet time-  Total Tourniquet Time Documented: Thigh (Right) - 49 minutes Total: Thigh (Right) - 49 minutes     Complications- None  Condition-PACU - hemodynamically stable.   Brief Clinical Note  Teresa Barajas is a 68 y.o. year old female with end stage OA of her right knee with progressively worsening pain and dysfunction. She has constant pain, with activity and at rest and significant functional deficits with difficulties even with ADLs. She has had extensive non-op management including analgesics, injections of cortisone and viscosupplements, and home exercise program, but remains in significant pain with significant dysfunction.Radiographs show bone on bone arthritis all 3 compartments with tibial subluxation. She presents now for right Total Knee Arthroplasty.    Procedure in detail---   The patient is brought into the operating room and positioned supine on the operating table. After successful administration of  Adductor canal block and spinal,   a tourniquet is placed high on the  Right thigh(s) and the lower extremity is prepped and draped in the usual sterile fashion. Time out is performed by the operating team and then the  Right lower extremity is wrapped in Esmarch, knee flexed and the tourniquet inflated to 300 mmHg.       A midline incision is made with a ten blade through the subcutaneous tissue to the level of the extensor mechanism. A fresh blade is used to make a medial parapatellar arthrotomy. Soft tissue over the proximal medial tibia is subperiosteally elevated to the joint line with a knife and into the semimembranosus  bursa with a Cobb elevator. Soft tissue over the proximal lateral tibia is elevated with attention being paid to avoiding the patellar tendon on the tibial tubercle. The patella is everted, knee flexed 90 degrees and the ACL and PCL are removed. Findings are bone on bone all 3 compartments with massive global osteophytes and massive loose calcified bodies        The drill is used to create a starting hole in the distal femur and the canal is thoroughly irrigated with sterile saline to remove the fatty contents. The 5 degree Right  valgus alignment guide is placed into the femoral canal and the distal femoral cutting block is pinned to remove 9 mm off the distal femur. Resection is made with an oscillating saw.      The tibia is subluxed forward and the menisci are removed. The extramedullary alignment guide is placed referencing proximally at the medial aspect of the tibial tubercle and distally along the second metatarsal axis and tibial crest. The block is pinned to remove 58mm off the more deficient medial  side. Resection is made with an oscillating saw. Size 4is the most appropriate size for the tibia and the proximal tibia is prepared with the modular drill and keel punch for that size.      The femoral sizing guide is placed and size 4 is most appropriate. Rotation is marked off the epicondylar axis and confirmed by creating a rectangular flexion gap at 90 degrees. The size 4 cutting block is pinned in this rotation and the anterior, posterior and chamfer cuts are made with the oscillating saw. The intercondylar block is then  placed and that cut is made.      Trial size 4 tibial component, trial size 4 posterior stabilized femur and a 14  mm posterior stabilized rotating platform insert trial is placed. Full extension is achieved with excellent varus/valgus and anterior/posterior balance throughout full range of motion. The patella is everted and thickness measured to be 22  mm. Free hand resection is  taken to 12 mm, a 32 template is placed, lug holes are drilled, trial patella is placed, and it tracks normally. Osteophytes are removed off the posterior femur with the trial in place. All trials are removed and the cut bone surfaces prepared with pulsatile lavage. Cement is mixed and once ready for implantation, the size 4 tibial implant, size  4 posterior stabilized femoral component, and the size 32 patella are cemented in place and the patella is held with the clamp. The trial insert is placed and the knee held in full extension. The Exparel (20 ml mixed with 60 ml saline) is injected into the extensor mechanism, posterior capsule, medial and lateral gutters and subcutaneous tissues.  All extruded cement is removed and once the cement is hard the permanent 14 mm posterior stabilized rotating platform insert is placed into the tibial tray.      The wound is copiously irrigated with saline solution and the extensor mechanism closed over a hemovac drain with #1 V-loc suture. The tourniquet is released for a total tourniquet time of 49  minutes. Flexion against gravity is 140 degrees and the patella tracks normally. Subcutaneous tissue is closed with 2.0 vicryl and subcuticular with running 4.0 Monocryl. The incision is cleaned and dried and steri-strips and a bulky sterile dressing are applied. The limb is placed into a knee immobilizer and the patient is awakened and transported to recovery in stable condition.      Please note that a surgical assistant was a medical necessity for this procedure in order to perform it in a safe and expeditious manner. Surgical assistant was necessary to retract the ligaments and vital neurovascular structures to prevent injury to them and also necessary for proper positioning of the limb to allow for anatomic placement of the prosthesis.   Gus Rankin Margo Lama, MD    07/11/2018, 12:39 PM

## 2018-07-11 NOTE — Anesthesia Procedure Notes (Signed)
Date/Time: 07/11/2018 11:16 AM Performed by: Florene Route, CRNA Oxygen Delivery Method: Simple face mask

## 2018-07-11 NOTE — Evaluation (Signed)
Physical Therapy Evaluation Patient Details Name: Teresa Barajas MRN: [id number] DOB: [birth year] Today's Date: 07/11/2018   History of Present Illness  68 yo female s/p R TKR on 07/11/18. PMH includes RA, OSA on CPAP, IDA, IBS, HTN, glaucoma, asthma.   Clinical Impression  Pt presents with R knee and R ankle pain, decreased R knee ROM, difficulty performing bed mobility and transfers, increased time and effort to perform mobility tasks, and decreased tolerance for ambulation due to R knee/ankle pain. Pt to benefit from acute PT to address deficits. Pt ambulated 20 ft with RW with min guard assist, verbal cuing provided for safety and form provided throughout. Pt educated on ankle pumps (20/hour) to perform this afternoon/evening to increase circulation, to pt's tolerance and limited by pain. PT to progress mobility as tolerated, and will continue to follow acutely.        Follow Up Recommendations Follow surgeon's recommendation for DC plan and follow-up therapies;Supervision for mobility/OOB(OPPT)    Equipment Recommendations  None recommended by PT    Recommendations for Other Services       Precautions / Restrictions Precautions Precautions: Fall Required Braces or Orthoses: Knee Immobilizer - Right Knee Immobilizer - Right: On when out of bed or walking;Discontinue once straight leg raise with < 10 degree lag Restrictions Weight Bearing Restrictions: No Other Position/Activity Restrictions: WBAT       Mobility  Bed Mobility Overal bed mobility: Needs Assistance Bed Mobility: Supine to Sit     Supine to sit: Min assist;HOB elevated     General bed mobility comments: Min assist for RLE lifting and translation to EOB. Increased time and effort, use of bed rails.  Transfers Overall transfer level: Needs assistance Equipment used: Rolling walker (2 wheeled) Transfers: Sit to/from Stand Sit to Stand: Min assist;From elevated surface         General transfer comment: Min  assist for steadying upon standing, increased time to rise. Verbal cuing for pushing up with at least one hand from bed surface when rising.   Ambulation/Gait Ambulation/Gait assistance: Min guard Gait Distance (Feet): 20 Feet Assistive device: Rolling walker (2 wheeled) Gait Pattern/deviations: Step-to pattern;Decreased weight shift to right;Decreased stance time - right;Antalgic;Trunk flexed Gait velocity: decr    General Gait Details: Min guard for safety. Verbal cuing for step-to sequencing, placement inside RW and taking small steps to stay within RW, turning with RW.   Stairs            Wheelchair Mobility    Modified Rankin (Stroke Patients Only)       Balance Overall balance assessment: Needs assistance Sitting-balance support: No upper extremity supported;Feet supported Sitting balance-Leahy Scale: Good     Standing balance support: Bilateral upper extremity supported Standing balance-Leahy Scale: Poor Standing balance comment: Relies on RW for steadying, support                             Pertinent Vitals/Pain Pain Assessment: 0-10 Pain Score: 8  Pain Location: R ankle and R knee Pain Descriptors / Indicators: Aching;Sore;Crying Pain Intervention(s): Limited activity within patient's tolerance;Repositioned;Ice applied;Monitored during session    Home Living Family/patient expects to be discharged to:: Private residence Living Arrangements: Spouse/significant other;Other relatives(husband, and sister lives in basement unit) Available Help at Discharge: Family Type of Home: House Home Access: Level entry     Home Layout: Laundry or work area in basement;Able to live on main level with bedroom/bathroom Home Equipment: Environmental consultant - 2  wheels;Cane - single point      Prior Function Level of Independence: Independent with assistive device(s)         Comments: Pt reports using cane for ambulation PTA, and occasionally needing cooking/cleaning  assist from sister when she is having an RA flare-up.      Hand Dominance   Dominant Hand: Right    Extremity/Trunk Assessment   Upper Extremity Assessment Upper Extremity Assessment: Overall WFL for tasks assessed    Lower Extremity Assessment Lower Extremity Assessment: Generalized weakness;RLE deficits/detail RLE Deficits / Details: suspected post-surgical weakness; able to perform ankle pumps, quad set, heel slide to ~75*, and SLR with slight knee flexion  RLE Sensation: WNL    Cervical / Trunk Assessment Cervical / Trunk Assessment: Normal  Communication   Communication: No difficulties  Cognition Arousal/Alertness: Awake/alert Behavior During Therapy: WFL for tasks assessed/performed Overall Cognitive Status: Within Functional Limits for tasks assessed                                        General Comments      Exercises Total Joint Exercises Goniometric ROM: knee aarom ~5-75*, limited by pain    Assessment/Plan    PT Assessment Patient needs continued PT services  PT Problem List Decreased strength;Pain;Decreased range of motion;Decreased activity tolerance;Decreased knowledge of use of DME;Decreased balance;Decreased mobility;Decreased safety awareness       PT Treatment Interventions DME instruction;Therapeutic activities;Gait training;Therapeutic exercise;Patient/family education;Balance training;Functional mobility training    PT Goals (Current goals can be found in the Care Plan section)  Acute Rehab PT Goals Patient Stated Goal: go home, walk better PT Goal Formulation: With patient Time For Goal Achievement: 07/18/18 Potential to Achieve Goals: Good    Frequency 7X/week   Barriers to discharge        Co-evaluation               AM-PAC PT "6 Clicks" Mobility  Outcome Measure Help needed turning from your back to your side while in a flat bed without using bedrails?: A Little Help needed moving from lying on your back  to sitting on the side of a flat bed without using bedrails?: A Little Help needed moving to and from a bed to a chair (including a wheelchair)?: A Little Help needed standing up from a chair using your arms (e.g., wheelchair or bedside chair)?: A Little Help needed to walk in hospital room?: A Little Help needed climbing 3-5 steps with a railing? : A Lot 6 Click Score: 17    End of Session Equipment Utilized During Treatment: Gait belt;Right knee immobilizer Activity Tolerance: Patient tolerated treatment well;Patient limited by pain Patient left: in chair;with call bell/phone within reach;with chair alarm set;with family/visitor present;with SCD's reapplied(R SCD off, NT notified and NT to reapply) Nurse Communication: Mobility status PT Visit Diagnosis: Other abnormalities of gait and mobility (R26.89);Difficulty in walking, not elsewhere classified (R26.2)    Time: [id number] PT Time Calculation (min) (ACUTE ONLY): 28 min   Charges:   PT Evaluation $PT Eval Low Complexity: 1 Low PT Treatments $Gait Training: 8-22 mins      Nicola PoliceAlexa D Arthea Nobel, PT Acute Rehabilitation Services Pager [phone]  Office [phone]   Dominion Kathan D Despina Hiddenure 07/11/2018, 6:11 PM

## 2018-07-11 NOTE — Interval H&P Note (Signed)
History and Physical Interval Note:  07/11/2018 9:16 AM  Teresa Barajas  has presented today for surgery, with the diagnosis of right knee osteoarthritis.  The various methods of treatment have been discussed with the patient and family. After consideration of risks, benefits and other options for treatment, the patient has consented to  Procedure(s): TOTAL KNEE ARTHROPLASTY (Right) as a surgical intervention.  The patient's history has been reviewed, patient examined, no change in status, stable for surgery.  I have reviewed the patient's chart and labs.  Questions were answered to the patient's satisfaction.     Homero Fellers Emersen Mascari

## 2018-07-11 NOTE — Anesthesia Postprocedure Evaluation (Signed)
Anesthesia Post Note  Patient: Teresa Barajas  Procedure(s) Performed: TOTAL KNEE ARTHROPLASTY (Right Knee)     Patient location during evaluation: PACU Anesthesia Type: Spinal Level of consciousness: awake and alert Pain management: pain level controlled Vital Signs Assessment: post-procedure vital signs reviewed and stable Respiratory status: spontaneous breathing and respiratory function stable Cardiovascular status: blood pressure returned to baseline and stable Postop Assessment: no headache, no backache, spinal receding and no apparent nausea or vomiting Anesthetic complications: no    Last Vitals:  Vitals:   07/11/18 1516 07/11/18 1612  BP: (!) 162/76 (!) 178/78  Pulse: (!) 52 (!) 55  Resp: 16 19  Temp:  36.7 C  SpO2: 100% 100%    Last Pain:  Vitals:   07/11/18 1611  TempSrc:   PainSc: 4                  Phillips Grout

## 2018-07-11 NOTE — Anesthesia Procedure Notes (Signed)
Date/Time: 07/11/2018 11:16 AM Performed by: Docie Abramovich L, CRNA Oxygen Delivery Method: Simple face mask       

## 2018-07-11 NOTE — Discharge Instructions (Signed)
° °Dr. Frank Aluisio °Total Joint Specialist °[facility] °[address] °[place], [place] [address] °[phone] ° °TOTAL KNEE REPLACEMENT POSTOPERATIVE DIRECTIONS ° °Knee Rehabilitation, Guidelines Following Surgery  °Results after knee surgery are often greatly improved when you follow the exercise, range of motion and muscle strengthening exercises prescribed by your doctor. Safety measures are also important to protect the knee from further injury. Any time any of these exercises cause you to have increased pain or swelling in your knee joint, decrease the amount until you are comfortable again and slowly increase them. If you have problems or questions, call your caregiver or physical therapist for advice.  ° °HOME CARE INSTRUCTIONS  °• Remove items at home which could result in a fall. This includes throw rugs or furniture in walking pathways.  °· ICE to the affected knee every three hours for 30 minutes at a time and then as needed for pain and swelling.  Continue to use ice on the knee for pain and swelling from surgery. You may notice swelling that will progress down to the foot and ankle.  This is normal after surgery.  Elevate the leg when you are not up walking on it.   °· Continue to use the breathing machine which will help keep your temperature down.  It is common for your temperature to cycle up and down following surgery, especially at night when you are not up moving around and exerting yourself.  The breathing machine keeps your lungs expanded and your temperature down. °· Do not place pillow under knee, focus on keeping the knee straight while resting ° °DIET °You may resume your previous home diet once your are discharged from the hospital. ° °DRESSING / WOUND CARE / SHOWERING °You may change your dressing 3-5 days after surgery.  Then change the dressing every day with sterile gauze.  Please use good hand washing techniques before changing the dressing.  Do not use any lotions  or creams on the incision until instructed by your surgeon. °You may start showering once you are discharged home but do not submerge the incision under water. Just pat the incision dry and apply a dry gauze dressing on daily. °Change the surgical dressing daily and reapply a dry dressing each time. ° °ACTIVITY °Walk with your walker as instructed. °Use walker as long as suggested by your caregivers. °Avoid periods of inactivity such as sitting longer than an hour when not asleep. This helps prevent blood clots.  °You may resume a sexual relationship in one month or when given the OK by your doctor.  °You may return to work once you are cleared by your doctor.  °Do not drive a car for 6 weeks or until released by you surgeon.  °Do not drive while taking narcotics. ° °WEIGHT BEARING °Weight bearing as tolerated with assist device (walker, cane, etc) as directed, use it as long as suggested by your surgeon or therapist, typically at least 4-6 weeks. ° °POSTOPERATIVE CONSTIPATION PROTOCOL °Constipation - defined medically as fewer than three stools per week and severe constipation as less than one stool per week. ° °One of the most common issues patients have following surgery is constipation.  Even if you have a regular bowel pattern at home, your normal regimen is likely to be disrupted due to multiple reasons following surgery.  Combination of anesthesia, postoperative narcotics, change in appetite and fluid intake all can affect your bowels.  In order to avoid complications following surgery, here are some   recommendations in order to help you during your recovery period. ° °Colace (docusate) - Pick up an over-the-counter form of Colace or another stool softener and take twice a day as long as you are requiring postoperative pain medications.  Take with a full glass of water daily.  If you experience loose stools or diarrhea, hold the colace until you stool forms back up.  If your symptoms do not get better within 1  week or if they get worse, check with your doctor. ° °Dulcolax (bisacodyl) - Pick up over-the-counter and take as directed by the product packaging as needed to assist with the movement of your bowels.  Take with a full glass of water.  Use this product as needed if not relieved by Colace only.  ° °MiraLax (polyethylene glycol) - Pick up over-the-counter to have on hand.  MiraLax is a solution that will increase the amount of water in your bowels to assist with bowel movements.  Take as directed and can mix with a glass of water, juice, soda, coffee, or tea.  Take if you go more than two days without a movement. °Do not use MiraLax more than once per day. Call your doctor if you are still constipated or irregular after using this medication for 7 days in a row. ° °If you continue to have problems with postoperative constipation, please contact the office for further assistance and recommendations.  If you experience "the worst abdominal pain ever" or develop nausea or vomiting, please contact the office immediatly for further recommendations for treatment. ° °ITCHING °If you experience itching with your medications, try taking only a single pain pill, or even half a pain pill at a time.  You can also use Benadryl over the counter for itching or also to help with sleep.  ° °TED HOSE STOCKINGS °Wear the elastic stockings on both legs for three weeks following surgery during the day but you may remove then at night for sleeping. ° °MEDICATIONS °See your medication summary on the “After Visit Summary” that the nursing staff will review with you prior to discharge.  You may have some home medications which will be placed on hold until you complete the course of blood thinner medication.  It is important for you to complete the blood thinner medication as prescribed by your surgeon.  Continue your approved medications as instructed at time of discharge. ° °PRECAUTIONS °If you experience chest pain or shortness of breath -  call 911 immediately for transfer to the hospital emergency department.  °If you develop a fever greater that 101 F, purulent drainage from wound, increased redness or drainage from wound, foul odor from the wound/dressing, or calf pain - CONTACT YOUR SURGEON.   °                                                °FOLLOW-UP APPOINTMENTS °Make sure you keep all of your appointments after your operation with your surgeon and caregivers. You should call the office at the above phone number and make an appointment for approximately two weeks after the date of your surgery or on the date instructed by your surgeon outlined in the "After Visit Summary". ° °RANGE OF MOTION AND STRENGTHENING EXERCISES  °Rehabilitation of the knee is important following a knee injury or an operation. After just a few days of immobilization, the muscles of   the thigh which control the knee become weakened and shrink (atrophy). Knee exercises are designed to build up the tone and strength of the thigh muscles and to improve knee motion. Often times heat used for twenty to thirty minutes before working out will loosen up your tissues and help with improving the range of motion but do not use heat for the first two weeks following surgery. These exercises can be done on a training (exercise) mat, on the floor, on a table or on a bed. Use what ever works the best and is most comfortable for you Knee exercises include:  °• Leg Lifts - While your knee is still immobilized in a splint or cast, you can do straight leg raises. Lift the leg to 60 degrees, hold for 3 sec, and slowly lower the leg. Repeat 10-20 times 2-3 times daily. Perform this exercise against resistance later as your knee gets better.  °• Quad and Hamstring Sets - Tighten up the muscle on the front of the thigh (Quad) and hold for 5-10 sec. Repeat this 10-20 times hourly. Hamstring sets are done by pushing the foot backward against an object and holding for 5-10 sec. Repeat as with quad  sets.  °· Leg Slides: Lying on your back, slowly slide your foot toward your buttocks, bending your knee up off the floor (only go as far as is comfortable). Then slowly slide your foot back down until your leg is flat on the floor again. °· Angel Wings: Lying on your back spread your legs to the side as far apart as you can without causing discomfort.  °A rehabilitation program following serious knee injuries can speed recovery and prevent re-injury in the future due to weakened muscles. Contact your doctor or a physical therapist for more information on knee rehabilitation.  ° °IF YOU ARE TRANSFERRED TO A SKILLED REHAB FACILITY °If the patient is transferred to a skilled rehab facility following release from the hospital, a list of the current medications will be sent to the facility for the patient to continue.  When discharged from the skilled rehab facility, please have the facility set up the patient's Home Health Physical Therapy prior to being released. Also, the skilled facility will be responsible for providing the patient with their medications at time of release from the facility to include their pain medication, the muscle relaxants, and their blood thinner medication. If the patient is still at the rehab facility at time of the two week follow up appointment, the skilled rehab facility will also need to assist the patient in arranging follow up appointment in our office and any transportation needs. ° °MAKE SURE YOU:  °• Understand these instructions.  °• Get help right away if you are not doing well or get worse.  ° ° °Pick up stool softner and laxative for home use following surgery while on pain medications. °Do not submerge incision under water. °Please use good hand washing techniques while changing dressing each day. °May shower starting three days after surgery. °Please use a clean towel to pat the incision dry following showers. °Continue to use ice for pain and swelling after surgery. °Do not  use any lotions or creams on the incision until instructed by your surgeon. ° °

## 2018-07-11 NOTE — Anesthesia Procedure Notes (Signed)
Anesthesia Regional Block: Adductor canal block   Pre-Anesthetic Checklist: ,, timeout performed, Correct Patient, Correct Site, Correct Laterality, Correct Procedure, Correct Position, site marked, Risks and benefits discussed,  Surgical consent,  Pre-op evaluation,  At surgeon's request and post-op pain management  Laterality: Right and Lower  Prep: Maximum Sterile Barrier Precautions used, chloraprep       Needles:  Injection technique: Single-shot  Needle Type: Echogenic Stimulator Needle     Needle Length: 10cm      Additional Needles:   Procedures:,,,, ultrasound used (permanent image in chart),,,,  Narrative:  Start time: 07/11/2018 10:42 AM End time: 07/11/2018 10:47 AM Injection made incrementally with aspirations every 5 mL.  Performed by: Personally  Anesthesiologist: Phillips Grout, MD  Additional Notes: Risks, benefits and alternative to block explained extensively.  Patient tolerated procedure well, without complications.

## 2018-07-11 NOTE — Anesthesia Procedure Notes (Signed)
Spinal  Patient location during procedure: OR Staffing Anesthesiologist: Jerzy Crotteau, MD Performed: anesthesiologist  Preanesthetic Checklist Completed: patient identified, site marked, surgical consent, pre-op evaluation, timeout performed, IV checked, risks and benefits discussed and monitors and equipment checked Spinal Block Patient position: sitting Prep: DuraPrep Patient monitoring: heart rate, continuous pulse ox and blood pressure Approach: right paramedian Location: L3-4 Injection technique: single-shot Needle Needle type: Sprotte  Needle gauge: 24 G Needle length: 9 cm Additional Notes Expiration date of kit checked and confirmed. Patient tolerated procedure well, without complications.       

## 2018-07-12 ENCOUNTER — Observation Stay (HOSPITAL_COMMUNITY): Payer: Medicare Other

## 2018-07-12 DIAGNOSIS — M7730 Calcaneal spur, unspecified foot: Secondary | ICD-10-CM | POA: Diagnosis present

## 2018-07-12 DIAGNOSIS — I10 Essential (primary) hypertension: Secondary | ICD-10-CM | POA: Diagnosis present

## 2018-07-12 DIAGNOSIS — Z96649 Presence of unspecified artificial hip joint: Secondary | ICD-10-CM | POA: Diagnosis present

## 2018-07-12 DIAGNOSIS — G4733 Obstructive sleep apnea (adult) (pediatric): Secondary | ICD-10-CM | POA: Diagnosis present

## 2018-07-12 DIAGNOSIS — J45909 Unspecified asthma, uncomplicated: Secondary | ICD-10-CM | POA: Diagnosis present

## 2018-07-12 DIAGNOSIS — M1711 Unilateral primary osteoarthritis, right knee: Secondary | ICD-10-CM | POA: Diagnosis present

## 2018-07-12 DIAGNOSIS — E039 Hypothyroidism, unspecified: Secondary | ICD-10-CM | POA: Diagnosis present

## 2018-07-12 DIAGNOSIS — Z6835 Body mass index (BMI) 35.0-35.9, adult: Secondary | ICD-10-CM | POA: Diagnosis not present

## 2018-07-12 DIAGNOSIS — M25561 Pain in right knee: Secondary | ICD-10-CM | POA: Diagnosis present

## 2018-07-12 DIAGNOSIS — M25571 Pain in right ankle and joints of right foot: Secondary | ICD-10-CM | POA: Diagnosis present

## 2018-07-12 DIAGNOSIS — D509 Iron deficiency anemia, unspecified: Secondary | ICD-10-CM | POA: Diagnosis present

## 2018-07-12 DIAGNOSIS — E669 Obesity, unspecified: Secondary | ICD-10-CM | POA: Diagnosis present

## 2018-07-12 DIAGNOSIS — M069 Rheumatoid arthritis, unspecified: Secondary | ICD-10-CM | POA: Diagnosis present

## 2018-07-12 LAB — CBC
HCT: 33.1 % — ABNORMAL LOW (ref 36.0–46.0)
Hemoglobin: 10.5 g/dL — ABNORMAL LOW (ref 12.0–15.0)
MCH: 31.9 pg (ref 26.0–34.0)
MCHC: 31.7 g/dL (ref 30.0–36.0)
MCV: 100.6 fL — AB (ref 80.0–100.0)
Platelets: 194 10*3/uL (ref 150–400)
RBC: 3.29 MIL/uL — ABNORMAL LOW (ref 3.87–5.11)
RDW: 12.4 % (ref 11.5–15.5)
WBC: 9 10*3/uL (ref 4.0–10.5)
nRBC: 0 % (ref 0.0–0.2)

## 2018-07-12 LAB — BASIC METABOLIC PANEL
Anion gap: 7 (ref 5–15)
BUN: 11 mg/dL (ref 8–23)
CO2: 24 mmol/L (ref 22–32)
Calcium: 8.2 mg/dL — ABNORMAL LOW (ref 8.9–10.3)
Chloride: 105 mmol/L (ref 98–111)
Creatinine, Ser: 0.62 mg/dL (ref 0.44–1.00)
GFR calc Af Amer: >60 mL/min (ref >60)
GFR calc non Af Amer: >60 mL/min (ref >60)
GLUCOSE: 150 mg/dL — AB (ref 70–99)
Potassium: 3.7 mmol/L (ref 3.5–5.1)
Sodium: 136 mmol/L (ref 135–145)

## 2018-07-12 MED ORDER — HYDROCODONE-ACETAMINOPHEN 5-325 MG PO TABS: 1.0000 | ORAL_TABLET | ORAL | Status: DC | PRN

## 2018-07-12 MED ORDER — HYDROCODONE-ACETAMINOPHEN 7.5-325 MG PO TABS
1.0000 | ORAL_TABLET | ORAL | Status: DC | PRN
  Administered 2018-07-12 – 2018-07-13 (×6): 2 via ORAL
  Filled 2018-07-12 (×6): qty 2

## 2018-07-12 NOTE — Care Management (Signed)
Per pt conversation OPPT appointment set up. [employer] to provide pt with RW. Sandford Craze RN,BSN [phone]

## 2018-07-12 NOTE — Progress Notes (Addendum)
Physical Therapy Treatment Patient Details Name: Teresa Barajas MRN: [id number] DOB: March 09, 1951 Today's Date: 07/12/2018    History of Present Illness 68 yo female s/p R TKR on 07/11/18. PMH includes RA, OSA on CPAP, IDA, IBS, HTN, glaucoma, asthma.Post op right ankle pain. X ray negative except heel spurs.    PT Comments    The patient reports right  Ankle pain persisting. Xray negative.  Continue PT. Plans DC tomorrow after PT goals are met.   Follow Up Recommendations  Follow surgeon's recommendation for DC plan and follow-up therapies;Supervision for mobility/OOB;Outpatient PT     Equipment Recommendations  Rolling walker with 5" wheels    Recommendations for Other Services       Precautions / Restrictions Precautions Precautions: Knee Required Braces or Orthoses: Knee Immobilizer - Right Knee Immobilizer - Right: On when out of bed or walking;Discontinue once straight leg raise with < 10 degree lag Restrictions Other Position/Activity Restrictions: WBAT     Mobility  Bed Mobility   Bed Mobility: Sit to Supine     Supine to sit: Min assist;HOB elevated Sit to supine: Min assist   General bed mobility comments: assist right leg onto bed  Transfers Overall transfer level: Needs assistance Equipment used: Rolling walker (2 wheeled) Transfers: Sit to/from [facility] Sit to Stand: Min assist;Mod assist Stand pivot transfers: Min assist;Mod assist       General transfer comment: multimodal cues and steady assist to stand from recliner and BSC and take small pivot steps to [facility] and  then bed. patient tended to not be inside the RW.  Ambulation/Gait Ambulation/Gait assistance: Min assist Gait Distance (Feet): 50 Feet Assistive device: Rolling walker (2 wheeled) Gait Pattern/deviations: Step-to pattern;Decreased weight shift to right;Decreased stance time - right;Antalgic;Trunk flexed Gait velocity: decr    General Gait Details: min guard then min assist  as patient got  back to room, reports increasing ankle pain on right   Stairs             Wheelchair Mobility    Modified Rankin (Stroke Patients Only)       Balance                                            Cognition Arousal/Alertness: Awake/alert Behavior During Therapy: Anxious Overall Cognitive Status: Within Functional Limits for tasks assessed                                        Exercises Total Joint Exercises Ankle Circles/Pumps: AROM;10 reps;Supine Quad Sets: AROM;10 reps;Supine Heel Slides: AAROM;Right;10 reps;Supine Hip ABduction/ADduction: AAROM;Right;10 reps;Supine Straight Leg Raises: AAROM;Right;10 reps;Supine    General Comments        Pertinent Vitals/Pain Pain Score: 8  Pain Location: right  ankle Pain Descriptors / Indicators: Aching;Sore Pain Intervention(s): Monitored during session;Premedicated before session;Limited activity within patient's tolerance;Ice applied    Home Living                      Prior Function            PT Goals (current goals can now be found in the care plan section) Progress towards PT goals: Progressing toward goals    Frequency    7X/week      PT Plan  Current plan remains appropriate    Co-evaluation              AM-PAC PT "6 Clicks" Mobility   Outcome Measure  Help needed turning from your back to your side while in a flat bed without using bedrails?: A Lot Help needed moving from lying on your back to sitting on the side of a flat bed without using bedrails?: A Lot Help needed moving to and from a bed to a chair (including a wheelchair)?: A Lot Help needed standing up from a chair using your arms (e.g., wheelchair or bedside chair)?: A Lot Help needed to walk in hospital room?: A Lot Help needed climbing 3-5 steps with a railing? : Total 6 Click Score: 11    End of Session Equipment Utilized During Treatment: Right knee  immobilizer Activity Tolerance: Patient limited by pain Patient left: in bed;with call bell/phone within reach;with family/visitor present;with bed alarm set Nurse Communication: Mobility status PT Visit Diagnosis: Other abnormalities of gait and mobility (R26.89);Difficulty in walking, not elsewhere classified (R26.2);Pain Pain - Right/Left: Right Pain - part of body: Ankle and joints of foot;Knee     Time: 8828-0034 PT Time Calculation (min) (ACUTE ONLY): 27 min  Charges: therapeutic activity 8-22 Self care 8-22                   [place] Pager [phone] Office [phone]    Claretha Cooper 07/12/2018, 2:26 PM

## 2018-07-12 NOTE — Care Management Obs Status (Signed)
MEDICARE OBSERVATION STATUS NOTIFICATION   Patient Details  Name: Teresa Barajas MRN: [id number] Date of Birth: 05/19/1950   Medicare Observation Status Notification Given:  Yes    Bartholome Bill, RN 07/12/2018, 3:37 PM

## 2018-07-12 NOTE — Progress Notes (Signed)
Physical Therapy Treatment Patient Details Name: Teresa Barajas MRN: [id number] DOB: 01-21-1951 Today's Date: 07/12/2018    History of Present Illness 68 yo female s/p R TKR on 07/11/18. PMH includes RA, OSA on CPAP, IDA, IBS, HTN, glaucoma, asthma.     PT Comments    The patient ambulated x 50' , reported pain was controlled. After returning too room, patient complained of feeling hot and weak. Assisted  Back into bed. BP 164/78.  Patient began AAROM exercises and reported sudden onset of right lateral ankle pain that was shooting up the  Leg.  Repositioned the leg but ankle pain persisted. RN notified.   Follow Up Recommendations  Follow surgeon's recommendation for DC plan and follow-up therapies;Supervision for mobility/OOB;Outpatient PT     Equipment Recommendations  Rolling walker with 5" wheels    Recommendations for Other Services       Precautions / Restrictions Precautions Precautions: Knee Required Braces or Orthoses: Knee Immobilizer - Right Knee Immobilizer - Right: On when out of bed or walking;Discontinue once straight leg raise with < 10 degree lag Restrictions Weight Bearing Restrictions: No Other Position/Activity Restrictions: WBAT     Mobility  Bed Mobility   Bed Mobility: Supine to Sit     Supine to sit: Min assist;HOB elevated     General bed mobility comments: Min assist for RLE lifting and translation to EOB. Increased time and effort, use of bed rails.  Transfers Overall transfer level: Needs assistance Equipment used: Rolling walker (2 wheeled) Transfers: Sit to/from Stand           General transfer comment: Min assist for steadying upon standing, increased time to rise. Verbal cuing for pushing up with at least one hand from bed surface when rising.   Ambulation/Gait Ambulation/Gait assistance: Min assist Gait Distance (Feet): 50 Feet Assistive device: Rolling walker (2 wheeled) Gait Pattern/deviations: Step-to pattern;Decreased weight  shift to right;Decreased stance time - right;Antalgic;Trunk flexed Gait velocity: decr    General Gait Details: min guard then min assist as patient got  back to room, reports increasing ankle pain on right   Stairs             Wheelchair Mobility    Modified Rankin (Stroke Patients Only)       Balance                                            Cognition Arousal/Alertness: Awake/alert Behavior During Therapy: Anxious Overall Cognitive Status: Within Functional Limits for tasks assessed                                        Exercises Total Joint Exercises Ankle Circles/Pumps: AROM;10 reps;Supine Quad Sets: AROM;10 reps;Supine Heel Slides: AAROM;Right;10 reps;Supine Hip ABduction/ADduction: AAROM;Right;10 reps;Supine Straight Leg Raises: AAROM;Right;10 reps;Supine    General Comments        Pertinent Vitals/Pain Pain Score: 10-Worst pain ever Pain Location: right  aNKLE AFTER AMBULATING Pain Descriptors / Indicators: Aching;Sore;Crying Pain Intervention(s): Limited activity within patient's tolerance;Monitored during session;Premedicated before session;Repositioned;Ice applied    Home Living                      Prior Function            PT Goals (current goals  can now be found in the care plan section) Progress towards PT goals: Progressing toward goals    Frequency    7X/week      PT Plan Current plan remains appropriate    Co-evaluation              AM-PAC PT "6 Clicks" Mobility   Outcome Measure  Help needed turning from your back to your side while in a flat bed without using bedrails?: A Lot Help needed moving from lying on your back to sitting on the side of a flat bed without using bedrails?: A Lot Help needed moving to and from a bed to a chair (including a wheelchair)?: A Lot Help needed standing up from a chair using your arms (e.g., wheelchair or bedside chair)?: A Lot Help needed  to walk in hospital room?: A Lot Help needed climbing 3-5 steps with a railing? : Total 6 Click Score: 11    End of Session Equipment Utilized During Treatment: Gait belt;Right knee immobilizer Activity Tolerance: Patient limited by pain Patient left: in bed;with call bell/phone within reach;with family/visitor present Nurse Communication: Mobility status PT Visit Diagnosis: Other abnormalities of gait and mobility (R26.89);Difficulty in walking, not elsewhere classified (R26.2);Pain Pain - Right/Left: Right Pain - part of body: Ankle and joints of foot     Time: 4599-7741 PT Time Calculation (min) (ACUTE ONLY): 42 min  Charges:  $Gait Training: 8-22 mins $Therapeutic Exercise: 8-22 mins $Self Care/Home Management: 8-22                     Blanchard Kelch PT Acute Rehabilitation Services Pager [phone] Office [phone]    Rada Hay 07/12/2018, 12:51 PM

## 2018-07-12 NOTE — Progress Notes (Addendum)
   Subjective: 1 Day Post-Op Procedure(s) (LRB): TOTAL KNEE ARTHROPLASTY (Right) Patient reports pain as moderate.   Patient seen in rounds by Dr. Lequita Halt. Patient is complaining of both right knee and right ankle pain this AM. Foley catheter removed. Denies chest pain, SOB, or calf pain. No issues overnight.  We will continue therapy today.   Objective: Vital signs in last 24 hours: Temp:  [97.5 F (36.4 C)-99.1 F (37.3 C)] 98.2 F (36.8 C) (03/10 0512) Pulse Rate:  [49-62] 57 (03/10 0512) Resp:  [13-22] 16 (03/10 0107) BP: (131-178)/(66-92) 131/66 (03/10 0512) SpO2:  [96 %-100 %] 100 % (03/10 0512) Weight:  [92.1 kg] 92.1 kg (03/09 0855)  Intake/Output from previous day:  Intake/Output Summary (Last 24 hours) at 07/12/2018 0723 Last data filed at 07/12/2018 0625 Gross per 24 hour  Intake 4355.82 ml  Output 2835 ml  Net 1520.82 ml    Labs: Recent Labs    07/12/18 0345  HGB 10.5*   Recent Labs    07/12/18 0345  WBC 9.0  RBC 3.29*  HCT 33.1*  PLT 194   Recent Labs    07/12/18 0345  NA 136  K 3.7  CL 105  CO2 24  BUN 11  CREATININE 0.62  GLUCOSE 150*  CALCIUM 8.2*   Exam: General - Patient is Alert and Oriented Extremity - Neurologically intact Neurovascular intact Sensation intact distally Dorsiflexion/Plantar flexion intact Dressing - dressing C/D/I Motor Function - intact, moving foot and toes well on exam.   Past Medical History:  Diagnosis Date  . Asthma    pulmonologist-- dr Lucretia Field (in [place], [place])  . Glaucoma, both eyes    currently stable , no eye drops  . Hypertension   . Hypothyroidism   . IBS (irritable bowel syndrome)   . IDA (iron deficiency anemia)   . OA (osteoarthritis)    knees,   . OSA on CPAP   . RA (rheumatoid arthritis) (HCC)    schoraff--  methotrexate, gabapentin, folic acid, and cimzia infusion     Assessment/Plan: 1 Day Post-Op Procedure(s) (LRB): TOTAL KNEE ARTHROPLASTY (Right) Principal Problem:   OA  (osteoarthritis) of knee Active Problems:   Osteoarthritis of right knee  Estimated body mass index is 35.98 kg/m as calculated from the following:   Height as of this encounter: 5\' 3"  (1.6 m).   Weight as of this encounter: 92.1 kg. Advance diet Up with therapy  Anticipated LOS equal to or greater than 2 midnights due to - Age 69 and older with one or more of the following:  - Obesity  - Expected need for hospital services (PT, OT, Nursing) required for safe  discharge  - Anticipated need for postoperative skilled nursing care or inpatient rehab  - Active co-morbidities: None OR   - Unanticipated findings during/Post Surgery: None  - Patient is a high risk of re-admission due to: None    DVT Prophylaxis - Aspirin Weight bearing as tolerated. D/C O2 and pulse ox and try on room air. Hemovac pulled without difficulty, will continue therapy today.  Ankle pain most likely due to stress/manipulation during surgery. Should improve with ambulation. Patient endorses itching with oxycodone, switched oral narcotic medication to norco. Patient takes at home without issues.  Plan is to go Home after hospital stay. Plan for discharge tomorrow pending progress with therapy.  Arther Abbott, PA-C Orthopedic Surgery 07/12/2018, 7:23 AM

## 2018-07-13 ENCOUNTER — Encounter (HOSPITAL_COMMUNITY): Payer: Self-pay | Admitting: Orthopedic Surgery

## 2018-07-13 LAB — CBC
HCT: 32.6 % — ABNORMAL LOW (ref 36.0–46.0)
HEMOGLOBIN: 10.8 g/dL — AB (ref 12.0–15.0)
MCH: 32.1 pg (ref 26.0–34.0)
MCHC: 33.1 g/dL (ref 30.0–36.0)
MCV: 97 fL (ref 80.0–100.0)
Platelets: 163 10*3/uL (ref 150–400)
RBC: 3.36 MIL/uL — AB (ref 3.87–5.11)
RDW: 12.7 % (ref 11.5–15.5)
WBC: 6.6 10*3/uL (ref 4.0–10.5)
nRBC: 0 % (ref 0.0–0.2)

## 2018-07-13 LAB — BASIC METABOLIC PANEL
ANION GAP: 10 (ref 5–15)
BUN: 8 mg/dL (ref 8–23)
CHLORIDE: 99 mmol/L (ref 98–111)
CO2: 25 mmol/L (ref 22–32)
Calcium: 8.3 mg/dL — ABNORMAL LOW (ref 8.9–10.3)
Creatinine, Ser: 0.58 mg/dL (ref 0.44–1.00)
GFR calc Af Amer: >60 mL/min (ref >60)
GFR calc non Af Amer: >60 mL/min (ref >60)
Glucose, Bld: 105 mg/dL — ABNORMAL HIGH (ref 70–99)
Potassium: 3.8 mmol/L (ref 3.5–5.1)
Sodium: 134 mmol/L — ABNORMAL LOW (ref 135–145)

## 2018-07-13 MED ORDER — METHOCARBAMOL 500 MG PO TABS: 500.0000 mg | ORAL_TABLET | Freq: Four times a day (QID) | ORAL | 0 refills | Status: AC | PRN

## 2018-07-13 MED ORDER — HYDROCODONE-ACETAMINOPHEN 5-325 MG PO TABS: 1.0000 | ORAL_TABLET | Freq: Four times a day (QID) | ORAL | 0 refills | Status: AC | PRN

## 2018-07-13 MED ORDER — ASPIRIN 325 MG PO TBEC: 325.0000 mg | DELAYED_RELEASE_TABLET | Freq: Two times a day (BID) | ORAL | 0 refills | Status: AC

## 2018-07-13 NOTE — Plan of Care (Signed)
Plan of care reviewed and discussed with the patient. 

## 2018-07-13 NOTE — Plan of Care (Signed)
Patient discharged home in stable condition 

## 2018-07-13 NOTE — Discharge Summary (Signed)
Physician Discharge Summary   Patient ID: Teresa Barajas MRN: [id number] DOB/AGE: [birth year] 66 y.o.  Admit date: 07/11/2018 Discharge date: 07/13/2018  Primary Diagnosis: Osteoarthritis, right knee   Admission Diagnoses:  Past Medical History:  Diagnosis Date   Asthma    pulmonologist-- dr Lucretia Field (in [place], [place])   Glaucoma, both eyes    currently stable , no eye drops   Hypertension    Hypothyroidism    IBS (irritable bowel syndrome)    IDA (iron deficiency anemia)    OA (osteoarthritis)    knees,    OSA on CPAP    RA (rheumatoid arthritis) (HCC)    schoraff--  methotrexate, gabapentin, folic acid, and cimzia infusion    Discharge Diagnoses:   Principal Problem:   OA (osteoarthritis) of knee Active Problems:   Osteoarthritis of right knee   S/P total hip arthroplasty  Estimated body mass index is 35.98 kg/m as calculated from the following:   Height as of this encounter: 5\' 3"  (1.6 m).   Weight as of this encounter: 92.1 kg.  Procedure:  Procedure(s) (LRB): TOTAL KNEE ARTHROPLASTY (Right)   Consults: None  HPI: Teresa Barajas is a 68 y.o. year old female with end stage OA of her right knee with progressively worsening pain and dysfunction. She has constant pain, with activity and at rest and significant functional deficits with difficulties even with ADLs. She has had extensive non-op management including analgesics, injections of cortisone and viscosupplements, and home exercise program, but remains in significant pain with significant dysfunction.Radiographs show bone on bone arthritis all 3 compartments with tibial subluxation. She presents now for right Total Knee Arthroplasty.    Laboratory Data: Admission on 07/11/2018, Discharged on 07/13/2018  Component Date Value Ref Range Status   WBC 07/12/2018 9.0  4.0 - 10.5 K/uL Final   RBC 07/12/2018 3.29* 3.87 - 5.11 MIL/uL Final   Hemoglobin 07/12/2018 10.5* 12.0 - 15.0 g/dL Final   HCT 97/53/0051 33.1* 36.0  - 46.0 % Final   MCV 07/12/2018 100.6* 80.0 - 100.0 fL Final   MCH 07/12/2018 31.9  26.0 - 34.0 pg Final   MCHC 07/12/2018 31.7  30.0 - 36.0 g/dL Final   RDW 02/22/1172 12.4  11.5 - 15.5 % Final   Platelets 07/12/2018 194  150 - 400 K/uL Final   nRBC 07/12/2018 0.0  0.0 - 0.2 % Final   Performed at [facility], [address] [place], [place] [address]   Sodium 07/12/2018 136  135 - 145 mmol/L Final   Potassium 07/12/2018 3.7  3.5 - 5.1 mmol/L Final   Chloride 07/12/2018 105  98 - 111 mmol/L Final   CO2 07/12/2018 24  22 - 32 mmol/L Final   Glucose, Bld 07/12/2018 150* 70 - 99 mg/dL Final   BUN 41/07/129 11  8 - 23 mg/dL Final   Creatinine, Ser 07/12/2018 0.62  0.44 - 1.00 mg/dL Final   Calcium 43/88/8757 8.2* 8.9 - 10.3 mg/dL Final   GFR calc non Af Amer 07/12/2018 >60  >60 mL/min Final   GFR calc Af Amer 07/12/2018 >60  >60 mL/min Final   Anion gap 07/12/2018 7  5 - 15 Final   Performed at [facility], [address], [place], [place] [address]   WBC 07/13/2018 6.6  4.0 - 10.5 K/uL Final   RBC 07/13/2018 3.36* 3.87 - 5.11 MIL/uL Final   Hemoglobin 07/13/2018 10.8* 12.0 - 15.0 g/dL Final   HCT 10/02/5613 32.6* 36.0 - 46.0 %  Final   MCV 07/13/2018 97.0  80.0 - 100.0 fL Final   MCH 07/13/2018 32.1  26.0 - 34.0 pg Final   MCHC 07/13/2018 33.1  30.0 - 36.0 g/dL Final   RDW 16/10/960403/03/2019 12.7  11.5 - 15.5 % Final   Platelets 07/13/2018 163  150 - 400 K/uL Final   nRBC 07/13/2018 0.0  0.0 - 0.2 % Final   Performed at [facility], [address] [place], [place] [address]   Sodium 07/13/2018 134* 135 - 145 mmol/L Final   Potassium 07/13/2018 3.8  3.5 - 5.1 mmol/L Final   Chloride 07/13/2018 99  98 - 111 mmol/L Final   CO2 07/13/2018 25  22 - 32 mmol/L Final   Glucose, Bld 07/13/2018 105* 70 - 99 mg/dL Final   BUN 11/91/478203/03/2019 8  8 - 23 mg/dL Final   Creatinine, Ser 07/13/2018 0.58  0.44 - 1.00  mg/dL Final   Calcium 95/62/130803/03/2019 8.3* 8.9 - 10.3 mg/dL Final   GFR calc non Af Amer 07/13/2018 >60  >60 mL/min Final   GFR calc Af Amer 07/13/2018 >60  >60 mL/min Final   Anion gap 07/13/2018 10  5 - 15 Final   Performed at [facility], [address] [place], [place] [address]  Hospital Outpatient Visit on 07/04/2018  Component Date Value Ref Range Status   aPTT 07/04/2018 48* 24 - 36 seconds Final   Comment:        IF BASELINE aPTT IS ELEVATED, SUGGEST PATIENT RISK ASSESSMENT BE USED TO DETERMINE APPROPRIATE ANTICOAGULANT THERAPY. Performed at [facility], [address] [place], [place] [address]    WBC 07/04/2018 5.6  4.0 - 10.5 K/uL Final   RBC 07/04/2018 3.79* 3.87 - 5.11 MIL/uL Final   Hemoglobin 07/04/2018 12.4  12.0 - 15.0 g/dL Final   HCT 52/84/132403/06/2018 37.6  36.0 - 46.0 % Final   MCV 07/04/2018 99.2  80.0 - 100.0 fL Final   MCH 07/04/2018 32.7  26.0 - 34.0 pg Final   MCHC 07/04/2018 33.0  30.0 - 36.0 g/dL Final   RDW 40/10/272503/06/2018 12.7  11.5 - 15.5 % Final   Platelets 07/04/2018 230  150 - 400 K/uL Final   nRBC 07/04/2018 0.0  0.0 - 0.2 % Final   Performed at [facility], [address] [place], [place] [address]   Sodium 07/04/2018 139  135 - 145 mmol/L Final   Potassium 07/04/2018 3.6  3.5 - 5.1 mmol/L Final   Chloride 07/04/2018 103  98 - 111 mmol/L Final   CO2 07/04/2018 27  22 - 32 mmol/L Final   Glucose, Bld 07/04/2018 103* 70 - 99 mg/dL Final   BUN 03/47/425903/06/2018 16  8 - 23 mg/dL Final   Creatinine, Ser 07/04/2018 0.60  0.44 - 1.00 mg/dL Final   Calcium 56/38/756403/06/2018 9.1  8.9 - 10.3 mg/dL Final   Total Protein 33/29/518803/06/2018 7.2  6.5 - 8.1 g/dL Final   Albumin 41/66/063003/06/2018 4.4  3.5 - 5.0 g/dL Final   AST 16/01/093203/06/2018 22  15 - 41 U/L Final   ALT 07/04/2018 19  0 - 44 U/L Final   Alkaline Phosphatase 07/04/2018 53  38 - 126 U/L Final   Total Bilirubin 07/04/2018 0.4  0.3 - 1.2 mg/dL Final    GFR calc non Af Amer 07/04/2018 >60  >60 mL/min Final   GFR calc Af Amer 07/04/2018 >60  >60 mL/min Final   Anion gap 07/04/2018 9  5 -  15 Final   Performed at [facility], [address] [place], [place] [address]   Prothrombin Time 07/04/2018 12.6  11.4 - 15.2 seconds Final   INR 07/04/2018 1.0  0.8 - 1.2 Final   Comment: (NOTE) INR goal varies based on device and disease states. Performed at [facility], [address], [place], [place] [address]    ABO/RH(D) 07/04/2018 A POS   Final   Antibody Screen 07/04/2018 NEG   Final   Sample Expiration 07/04/2018 07/14/2018   Final   Extend sample reason 07/04/2018    Final                   Value:NO TRANSFUSIONS OR PREGNANCY IN THE PAST 3 MONTHS Performed at [facility], [address], [place], [place] [address]    MRSA, PCR 07/04/2018 NEGATIVE  NEGATIVE Final   Staphylococcus aureus 07/04/2018 NEGATIVE  NEGATIVE Final   Comment: (NOTE) The Xpert SA Assay (FDA approved for NASAL specimens in patients 33 years of age and older), is one component of a comprehensive surveillance program. It is not intended to diagnose infection nor to guide or monitor treatment. Performed at [facility], [address] [place], [place] [address]    ABO/RH(D) 07/04/2018    Final                   Value:A POS Performed at [facility], [address], [place], [place] [address]      X-Rays:Dg Ankle Right Port  Result Date: 07/12/2018 CLINICAL DATA:  Right knee arthroplasty. Severe pain and ankle joint. EXAM: PORTABLE RIGHT ANKLE - 2 VIEW COMPARISON:  None. FINDINGS: There is no evidence of fracture, dislocation, or joint effusion. Posterior and plantar calcaneal heel spurs. Extensive venous calcifications noted. Soft tissues are unremarkable. IMPRESSION: 1. No acute bone abnormality. 2. Heel spurs. Electronically Signed   By: Signa Kell M.D.    On: 07/12/2018 14:04    EKG:No orders found for this or any previous visit.   Hospital Course: Teresa Barajas is a 68 y.o. who was admitted to [facility]. They were brought to the operating room on 07/11/2018 and underwent Procedure(s): TOTAL KNEE ARTHROPLASTY.  Patient tolerated the procedure well and was later transferred to the recovery room and then to the orthopaedic floor for postoperative care. They were given PO and IV analgesics for pain control following their surgery. They were given 24 hours of postoperative antibiotics of  Anti-infectives (From admission, onward)   Start     Dose/Rate Route Frequency Ordered Stop   07/11/18 1730  ceFAZolin (ANCEF) IVPB 2g/100 mL premix     2 g 200 mL/hr over 30 Minutes Intravenous Every 6 hours 07/11/18 1415 07/12/18 0016   07/11/18 0845  ceFAZolin (ANCEF) IVPB 2g/100 mL premix     2 g 200 mL/hr over 30 Minutes Intravenous On call to O.R. 07/11/18 1308 07/11/18 1126     and started on DVT prophylaxis in the form of Aspirin.   PT and OT were ordered for total joint protocol. Discharge planning consulted to help with postop disposition and equipment needs. Patient had a decent night on the evening of surgery. They started to get up OOB with therapy on POD #0. Hemovac drain was pulled without difficulty on day one. Was complaining of right ankle pain, ordered an x-ray which showed no abnormalities. Continued to work with therapy into POD #2. Pt was seen during  rounds on day two and was ready to go home pending progress with therapy. Dressing was changed and the incision was clean, dry, and intact with no drainage. Ankle pain was much improved. Pt worked with therapy for one additional session and was meeting their goals. She was discharged to home later that day in stable condition.  Diet: Regular diet Activity: WBAT Follow-up: in 2 weeks Disposition: [facility] Discharged Condition: stable   Discharge Instructions    Call  MD / Call 911   Complete by:  As directed    If you experience chest pain or shortness of breath, CALL 911 and be transported to the hospital emergency room.  If you develope a fever above 101 F, pus (white drainage) or increased drainage or redness at the wound, or calf pain, call your surgeon's office.   Change dressing   Complete by:  As directed    Change the dressing daily with sterile 4 x 4 inch gauze dressing and apply TED hose.   Constipation Prevention   Complete by:  As directed    Drink plenty of fluids.  Prune juice may be helpful.  You may use a stool softener, such as Colace (over the counter) 100 mg twice a day.  Use MiraLax (over the counter) for constipation as needed.   Diet - low sodium heart healthy   Complete by:  As directed    Discharge instructions   Complete by:  As directed    Dr. Ollen GrossFrank Aluisio Total Joint Specialist [facility] [address] [place], [place] [address] [phone]  TOTAL KNEE REPLACEMENT POSTOPERATIVE DIRECTIONS  Knee Rehabilitation, Guidelines Following Surgery  Results after knee surgery are often greatly improved when you follow the exercise, range of motion and muscle strengthening exercises prescribed by your doctor. Safety measures are also important to protect the knee from further injury. Any time any of these exercises cause you to have increased pain or swelling in your knee joint, decrease the amount until you are comfortable again and slowly increase them. If you have problems or questions, call your caregiver or physical therapist for advice.   HOME CARE INSTRUCTIONS  Remove items at home which could result in a fall. This includes throw rugs or furniture in walking pathways.  ICE to the affected knee every three hours for 30 minutes at a time and then as needed for pain and swelling.  Continue to use ice on the knee for pain and swelling from surgery. You may notice swelling that will progress down to the foot and  ankle.  This is normal after surgery.  Elevate the leg when you are not up walking on it.   Continue to use the breathing machine which will help keep your temperature down.  It is common for your temperature to cycle up and down following surgery, especially at night when you are not up moving around and exerting yourself.  The breathing machine keeps your lungs expanded and your temperature down. Do not place pillow under knee, focus on keeping the knee straight while resting   DIET You may resume your previous home diet once your are discharged from the hospital.  DRESSING / WOUND CARE / SHOWERING You may change your dressing 3-5 days after surgery.  Then change the dressing every day with sterile gauze.  Please use good hand washing techniques before changing the dressing.  Do not use any lotions or creams on the incision until instructed by your  Careers adviser. You may start showering once you are discharged home but do not submerge the incision under water. Just pat the incision dry and apply a dry gauze dressing on daily. Change the surgical dressing daily and reapply a dry dressing each time.  ACTIVITY Walk with your walker as instructed. Use walker as long as suggested by your caregivers. Avoid periods of inactivity such as sitting longer than an hour when not asleep. This helps prevent blood clots.  You may resume a sexual relationship in one month or when given the OK by your doctor.  You may return to work once you are cleared by your doctor.  Do not drive a car for 6 weeks or until released by you surgeon.  Do not drive while taking narcotics.  WEIGHT BEARING Weight bearing as tolerated with assist device (walker, cane, etc) as directed, use it as long as suggested by your surgeon or therapist, typically at least 4-6 weeks.  POSTOPERATIVE CONSTIPATION PROTOCOL Constipation - defined medically as fewer than three stools per week and severe constipation as less than one stool per  week.  One of the most common issues patients have following surgery is constipation.  Even if you have a regular bowel pattern at home, your normal regimen is likely to be disrupted due to multiple reasons following surgery.  Combination of anesthesia, postoperative narcotics, change in appetite and fluid intake all can affect your bowels.  In order to avoid complications following surgery, here are some recommendations in order to help you during your recovery period.  Colace (docusate) - Pick up an over-the-counter form of Colace or another stool softener and take twice a day as long as you are requiring postoperative pain medications.  Take with a full glass of water daily.  If you experience loose stools or diarrhea, hold the colace until you stool forms back up.  If your symptoms do not get better within 1 week or if they get worse, check with your doctor.  Dulcolax (bisacodyl) - Pick up over-the-counter and take as directed by the product packaging as needed to assist with the movement of your bowels.  Take with a full glass of water.  Use this product as needed if not relieved by Colace only.   MiraLax (polyethylene glycol) - Pick up over-the-counter to have on hand.  MiraLax is a solution that will increase the amount of water in your bowels to assist with bowel movements.  Take as directed and can mix with a glass of water, juice, soda, coffee, or tea.  Take if you go more than two days without a movement. Do not use MiraLax more than once per day. Call your doctor if you are still constipated or irregular after using this medication for 7 days in a row.  If you continue to have problems with postoperative constipation, please contact the office for further assistance and recommendations.  If you experience "the worst abdominal pain ever" or develop nausea or vomiting, please contact the office immediatly for further recommendations for treatment.  ITCHING  If you experience itching with your  medications, try taking only a single pain pill, or even half a pain pill at a time.  You can also use Benadryl over the counter for itching or also to help with sleep.   TED HOSE STOCKINGS Wear the elastic stockings on both legs for three weeks following surgery during the day but you may remove then at night for sleeping.  MEDICATIONS See your medication summary on the "  After Visit Summary" that the nursing staff will review with you prior to discharge.  You may have some home medications which will be placed on hold until you complete the course of blood thinner medication.  It is important for you to complete the blood thinner medication as prescribed by your surgeon.  Continue your approved medications as instructed at time of discharge.  PRECAUTIONS If you experience chest pain or shortness of breath - call 911 immediately for transfer to the hospital emergency department.  If you develop a fever greater that 101 F, purulent drainage from wound, increased redness or drainage from wound, foul odor from the wound/dressing, or calf pain - CONTACT YOUR SURGEON.                                                   FOLLOW-UP APPOINTMENTS Make sure you keep all of your appointments after your operation with your surgeon and caregivers. You should call the office at the above phone number and make an appointment for approximately two weeks after the date of your surgery or on the date instructed by your surgeon outlined in the "After Visit Summary".   RANGE OF MOTION AND STRENGTHENING EXERCISES  Rehabilitation of the knee is important following a knee injury or an operation. After just a few days of immobilization, the muscles of the thigh which control the knee become weakened and shrink (atrophy). Knee exercises are designed to build up the tone and strength of the thigh muscles and to improve knee motion. Often times heat used for twenty to thirty minutes before working out will loosen up your tissues  and help with improving the range of motion but do not use heat for the first two weeks following surgery. These exercises can be done on a training (exercise) mat, on the floor, on a table or on a bed. Use what ever works the best and is most comfortable for you Knee exercises include:  Leg Lifts - While your knee is still immobilized in a splint or cast, you can do straight leg raises. Lift the leg to 60 degrees, hold for 3 sec, and slowly lower the leg. Repeat 10-20 times 2-3 times daily. Perform this exercise against resistance later as your knee gets better.  Quad and Hamstring Sets - Tighten up the muscle on the front of the thigh (Quad) and hold for 5-10 sec. Repeat this 10-20 times hourly. Hamstring sets are done by pushing the foot backward against an object and holding for 5-10 sec. Repeat as with quad sets.  Leg Slides: Lying on your back, slowly slide your foot toward your buttocks, bending your knee up off the floor (only go as far as is comfortable). Then slowly slide your foot back down until your leg is flat on the floor again. Angel Wings: Lying on your back spread your legs to the side as far apart as you can without causing discomfort.  A rehabilitation program following serious knee injuries can speed recovery and prevent re-injury in the future due to weakened muscles. Contact your doctor or a physical therapist for more information on knee rehabilitation.   IF YOU ARE TRANSFERRED TO A SKILLED REHAB FACILITY If the patient is transferred to a skilled rehab facility following release from the hospital, a list of the current medications will be sent to the facility for  the patient to continue.  When discharged from the skilled rehab facility, please have the facility set up the patient's Home Health Physical Therapy prior to being released. Also, the skilled facility will be responsible for providing the patient with their medications at time of release from the facility to include their  pain medication, the muscle relaxants, and their blood thinner medication. If the patient is still at the rehab facility at time of the two week follow up appointment, the skilled rehab facility will also need to assist the patient in arranging follow up appointment in our office and any transportation needs.  MAKE SURE YOU:  Understand these instructions.  Get help right away if you are not doing well or get worse.    Pick up stool softner and laxative for home use following surgery while on pain medications. Do not submerge incision under water. Please use good hand washing techniques while changing dressing each day. May shower starting three days after surgery. Please use a clean towel to pat the incision dry following showers. Continue to use ice for pain and swelling after surgery. Do not use any lotions or creams on the incision until instructed by your surgeon.   Do not put a pillow under the knee. Place it under the heel.   Complete by:  As directed    Driving restrictions   Complete by:  As directed    No driving for two weeks   TED hose   Complete by:  As directed    Use stockings (TED hose) for three weeks on both leg(s).  You may remove them at night for sleeping.   Weight bearing as tolerated   Complete by:  As directed      Allergies as of 07/13/2018      Reactions   Latex Other (See Comments)   Burns skin      Medication List    STOP taking these medications   HYDROcodone-acetaminophen 10-325 MG tablet Commonly known as:  NORCO Replaced by:  HYDROcodone-acetaminophen 5-325 MG tablet     TAKE these medications   amLODIPine-Valsartan-HCTZ 10-320-25 MG Tabs Take by mouth every evening.   aspirin 325 MG EC tablet Take 1 tablet (325 mg total) by mouth 2 (two) times daily for 19 days. Then resume one 81 mg aspirin once a day. What changed:    medication strength  how much to take  when to take this  additional instructions   Biotin 5 MG Tabs Take 5-10  mg by mouth See admin instructions. Take 5 mg by mouth in the morning and 10 mg in the evening   cetirizine 10 MG tablet Commonly known as:  ZYRTEC Take 10 mg by mouth every evening.   CIMZIA [place] Inject into the vein every 30 (thirty) days.   diclofenac sodium 1 % Gel Commonly known as:  VOLTAREN Apply topically 4 (four) times daily as needed.   dicyclomine 10 MG capsule Commonly known as:  BENTYL Take 10 mg by mouth 2 (two) times daily.   DULoxetine 60 MG capsule Commonly known as:  CYMBALTA Take 60 mg by mouth 2 (two) times daily.   Fish Oil 1000 MG Caps Take 1,000 mg by mouth 2 (two) times daily.   folic acid 1 MG tablet Commonly known as:  FOLVITE Take 1 mg by mouth every morning.   gabapentin 800 MG tablet Commonly known as:  NEURONTIN Take 800 mg by mouth 2 (two) times daily.   HYDROcodone-acetaminophen 5-325 MG tablet Commonly  known as:  NORCO/VICODIN Take 1-2 tablets by mouth every 6 (six) hours as needed for moderate pain. Replaces:  HYDROcodone-acetaminophen 10-325 MG tablet   hydroxychloroquine 200 MG tablet Commonly known as:  PLAQUENIL Take 400 mg by mouth 2 (two) times daily.   levothyroxine 75 MCG tablet Commonly known as:  SYNTHROID, LEVOTHROID Take 75 mcg by mouth daily before breakfast.   methocarbamol 500 MG tablet Commonly known as:  ROBAXIN Take 1 tablet (500 mg total) by mouth every 6 (six) hours as needed for muscle spasms.   methotrexate (PF) 50 MG/2ML injection Inject 25 mg into the vein once a week. Wednesday's   montelukast 10 MG tablet Commonly known as:  SINGULAIR Take 10 mg by mouth at bedtime.   multivitamin with minerals Tabs tablet Take 1 tablet by mouth 2 (two) times daily.   NALTREXONE HCL PO Take 3 mg by mouth 2 (two) times daily.   SM Calcium 500/Vitamin D3 500-400 MG-UNIT tablet Generic drug:  calcium-vitamin D Take 1 tablet by mouth 2 (two) times daily.   tiZANidine 4 MG tablet Commonly known as:  ZANAFLEX Take  4 mg by mouth 3 (three) times daily as needed for muscle spasms.            Discharge Care Instructions  (From admission, onward)         Start     Ordered   07/12/18 0000  Weight bearing as tolerated     07/12/18 0729   07/12/18 0000  Change dressing    Comments:  Change the dressing daily with sterile 4 x 4 inch gauze dressing and apply TED hose.   07/12/18 0729         Follow-up Information    Ollen Gross, MD. Schedule an appointment as soon as possible for a visit on 07/26/2018.   Specialty:  Orthopedic Surgery Contact information: [address] [place] [address] [phone]           Signed: Arther Abbott, PA-C Orthopedic Surgery 07/13/2018, 3:14 PM

## 2018-07-13 NOTE — Progress Notes (Signed)
Physical Therapy Treatment Patient Details Name: Teresa Barajas MRN: [id number] DOB: [birth year] Today's Date: 07/13/2018    History of Present Illness 68 yo female s/p R TKR on 07/11/18. PMH includes RA, OSA on CPAP, IDA, IBS, HTN, glaucoma, asthma.Post op right ankle pain. X ray negative except heel spurs.    PT Comments    Marked improvement in activity tolerance with improved pain control.  Pt up to bathroom, ambulated in hall and performed home therex program with written instruction provided.  Pt eager for dc home.   Follow Up Recommendations  Follow surgeon's recommendation for DC plan and follow-up therapies;Supervision for mobility/OOB;Outpatient PT     Equipment Recommendations  Rolling walker with 5" wheels    Recommendations for Other Services       Precautions / Restrictions Precautions Precautions: Knee Required Braces or Orthoses: Knee Immobilizer - Right Knee Immobilizer - Right: On when out of bed or walking;Discontinue once straight leg raise with < 10 degree lag(Pt performed IND SLR this am) Restrictions Weight Bearing Restrictions: No Other Position/Activity Restrictions: WBAT     Mobility  Bed Mobility Overal bed mobility: Needs Assistance Bed Mobility: Supine to Sit     Supine to sit: Min guard     General bed mobility comments: Pt self assisting R LE with UEs  Transfers Overall transfer level: Needs assistance Equipment used: Rolling walker (2 wheeled) Transfers: Sit to/from Stand Sit to Stand: Min guard;Supervision         General transfer comment: cues for LE management and use of UEs to self assist; pt up from EOB, to/from [facility], and to recliner  Ambulation/Gait Ambulation/Gait assistance: Min guard;Supervision Gait Distance (Feet): 90 Feet(and 15' into bathroom) Assistive device: Rolling walker (2 wheeled) Gait Pattern/deviations: Step-to pattern;Decreased weight shift to right;Decreased stance time - right;Antalgic;Trunk flexed Gait  velocity: decr    General Gait Details: min guard then min assist as patient got  back to room, reports increasing ankle pain on right   Stairs             Wheelchair Mobility    Modified Rankin (Stroke Patients Only)       Balance Overall balance assessment: Needs assistance Sitting-balance support: No upper extremity supported;Feet supported Sitting balance-Leahy Scale: Good     Standing balance support: No upper extremity supported Standing balance-Leahy Scale: Fair                              Cognition Arousal/Alertness: Awake/alert Behavior During Therapy: Anxious Overall Cognitive Status: Within Functional Limits for tasks assessed                                        Exercises Total Joint Exercises Ankle Circles/Pumps: AROM;Supine;20 reps Quad Sets: AROM;10 reps;Supine Heel Slides: AAROM;Right;Supine;20 reps Straight Leg Raises: Right;Supine;20 reps;AAROM;AROM Long Arc Quad: AAROM;AROM;Right;10 reps;Seated Knee Flexion: AAROM;Right;10 reps;Seated Goniometric ROM: AAROM R knee -8 - 45 with muscle guarding    General Comments        Pertinent Vitals/Pain Pain Assessment: 0-10 Pain Score: 4  Pain Location: R knee Pain Descriptors / Indicators: Aching;Sore Pain Intervention(s): Limited activity within patient's tolerance;Monitored during session;Premedicated before session;Ice applied    Home Living                      Prior Function  PT Goals (current goals can now be found in the care plan section) Acute Rehab PT Goals Patient Stated Goal: go home, walk better PT Goal Formulation: With patient Time For Goal Achievement: 07/18/18 Potential to Achieve Goals: Good Progress towards PT goals: Progressing toward goals    Frequency    7X/week      PT Plan Current plan remains appropriate    Co-evaluation              AM-PAC PT "6 Clicks" Mobility   Outcome Measure  Help needed  turning from your back to your side while in a flat bed without using bedrails?: A Little Help needed moving from lying on your back to sitting on the side of a flat bed without using bedrails?: A Little Help needed moving to and from a bed to a chair (including a wheelchair)?: A Little Help needed standing up from a chair using your arms (e.g., wheelchair or bedside chair)?: A Little Help needed to walk in hospital room?: A Little Help needed climbing 3-5 steps with a railing? : A Lot 6 Click Score: 17    End of Session Equipment Utilized During Treatment: Gait belt Activity Tolerance: Patient tolerated treatment well Patient left: in chair;with call bell/phone within reach;with family/visitor present Nurse Communication: Mobility status PT Visit Diagnosis: Other abnormalities of gait and mobility (R26.89);Difficulty in walking, not elsewhere classified (R26.2);Pain Pain - Right/Left: Right     Time: 6803-2122 PT Time Calculation (min) (ACUTE ONLY): 38 min  Charges:  $Gait Training: 8-22 mins $Therapeutic Exercise: 8-22 mins $Therapeutic Activity: 8-22 mins                     Mauro Kaufmann PT Acute Rehabilitation Services Pager [phone] Office [phone]    [facility] 07/13/2018, 12:43 PM

## 2018-07-13 NOTE — Progress Notes (Signed)
   Subjective: 2 Days Post-Op Procedure(s) (LRB): TOTAL KNEE ARTHROPLASTY (Right) Patient reports pain as mild.   Patient seen in rounds by Dr. Lequita Halt. Patient is well, and has had no acute complaints or problems. Pain in ankle has improved significantly per patient. X-ray from yesterday was negative. Voiding without difficulty and positive flatus. Denies chest pain, SOB or calf pain. Plan is to go Home after hospital stay.  Objective: Vital signs in last 24 hours: Temp:  [98 F (36.7 C)-98.8 F (37.1 C)] 98.8 F (37.1 C) (03/11 0601) Pulse Rate:  [63-75] 73 (03/11 0601) Resp:  [16-18] 18 (03/11 0601) BP: (136-150)/(58-77) 143/65 (03/11 0601) SpO2:  [97 %-100 %] 97 % (03/11 0601)  Intake/Output from previous day:  Intake/Output Summary (Last 24 hours) at 07/13/2018 0808 Last data filed at 07/12/2018 2117 Gross per 24 hour  Intake 703.97 ml  Output 700 ml  Net 3.97 ml    Labs: Recent Labs    07/12/18 0345 07/13/18 0540  HGB 10.5* 10.8*   Recent Labs    07/12/18 0345 07/13/18 0540  WBC 9.0 6.6  RBC 3.29* 3.36*  HCT 33.1* 32.6*  PLT 194 163   Recent Labs    07/12/18 0345 07/13/18 0540  NA 136 134*  K 3.7 3.8  CL 105 99  CO2 24 25  BUN 11 8  CREATININE 0.62 0.58  GLUCOSE 150* 105*  CALCIUM 8.2* 8.3*   Exam: General - Patient is Alert and Oriented Extremity - Neurologically intact Neurovascular intact Sensation intact distally Dorsiflexion/Plantar flexion intact Dressing/Incision - clean, dry, no drainage Motor Function - intact, moving foot and toes well on exam.   Past Medical History:  Diagnosis Date  . Asthma    pulmonologist-- dr Lucretia Field (in [place], [place])  . Glaucoma, both eyes    currently stable , no eye drops  . Hypertension   . Hypothyroidism   . IBS (irritable bowel syndrome)   . IDA (iron deficiency anemia)   . OA (osteoarthritis)    knees,   . OSA on CPAP   . RA (rheumatoid arthritis) (HCC)    schoraff--  methotrexate,  gabapentin, folic acid, and cimzia infusion     Assessment/Plan: 2 Days Post-Op Procedure(s) (LRB): TOTAL KNEE ARTHROPLASTY (Right) Principal Problem:   OA (osteoarthritis) of knee Active Problems:   Osteoarthritis of right knee   S/P total hip arthroplasty  Estimated body mass index is 35.98 kg/m as calculated from the following:   Height as of this encounter: 5\' 3"  (1.6 m).   Weight as of this encounter: 92.1 kg. Up with therapy D/C IV fluids  DVT Prophylaxis - Aspirin Weight-bearing as tolerated  Plan for discharge to home today. Scheduled for outpatient physical therapy at [facility]. Follow-up in the office in 2 weeks.   Arther Abbott, PA-C Orthopedic Surgery 07/13/2018, 8:08 AM

## 2019-06-12 ENCOUNTER — Inpatient Hospital Stay: Admit: 2019-06-12 | Payer: Medicare Other | Admitting: Orthopedic Surgery

## 2019-06-12 SURGERY — ARTHROPLASTY, KNEE, TOTAL
Anesthesia: Choice | Site: Knee | Laterality: Left

## 2020-09-19 IMAGING — DX PORTABLE RIGHT ANKLE - 2 VIEW
3 series · 3 of 3 positions shown · non-contrast
Comparison: None.

CLINICAL DATA: Right knee arthroplasty. Severe pain and ankle
joint.

EXAM:
PORTABLE RIGHT ANKLE - 2 VIEW

[ankle ap]
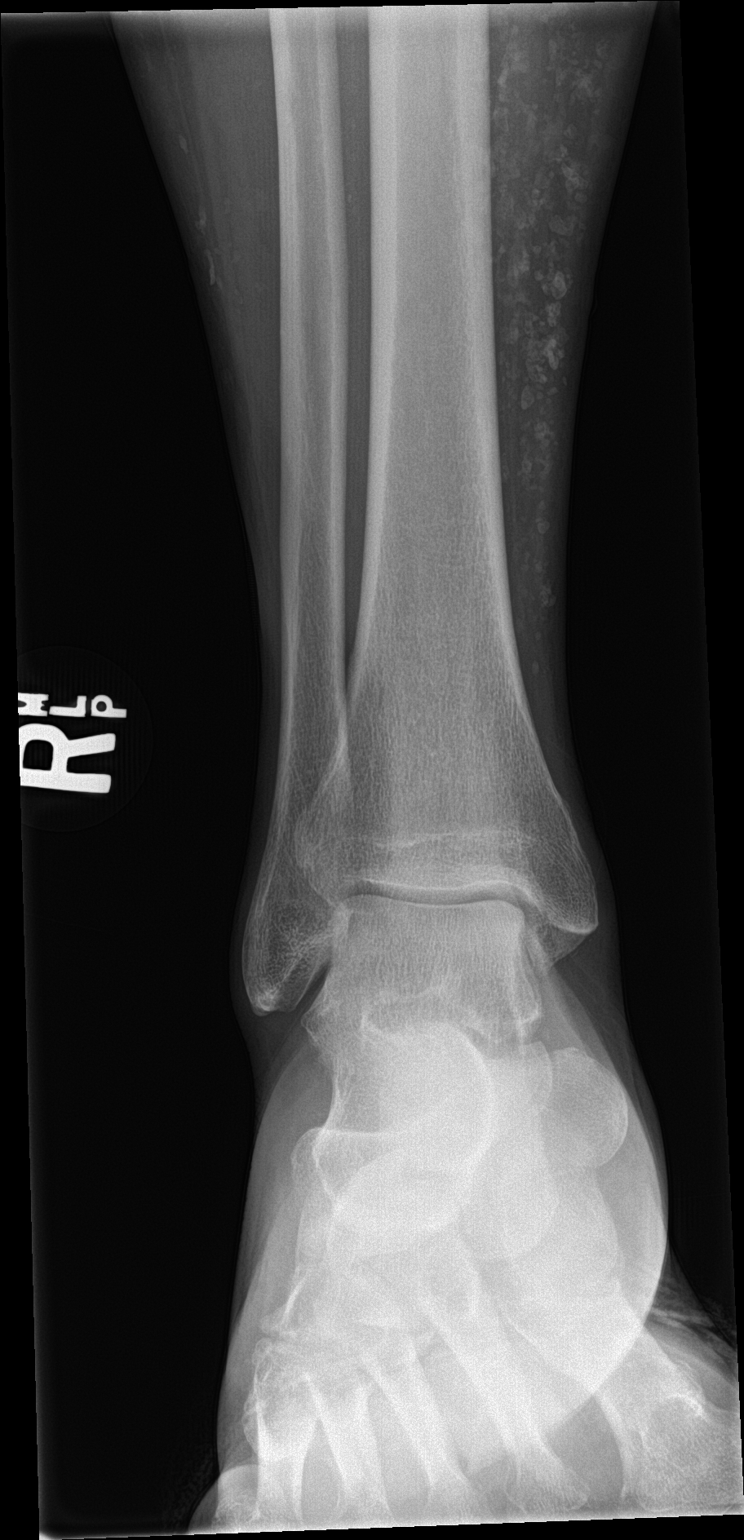

[ankle lat]
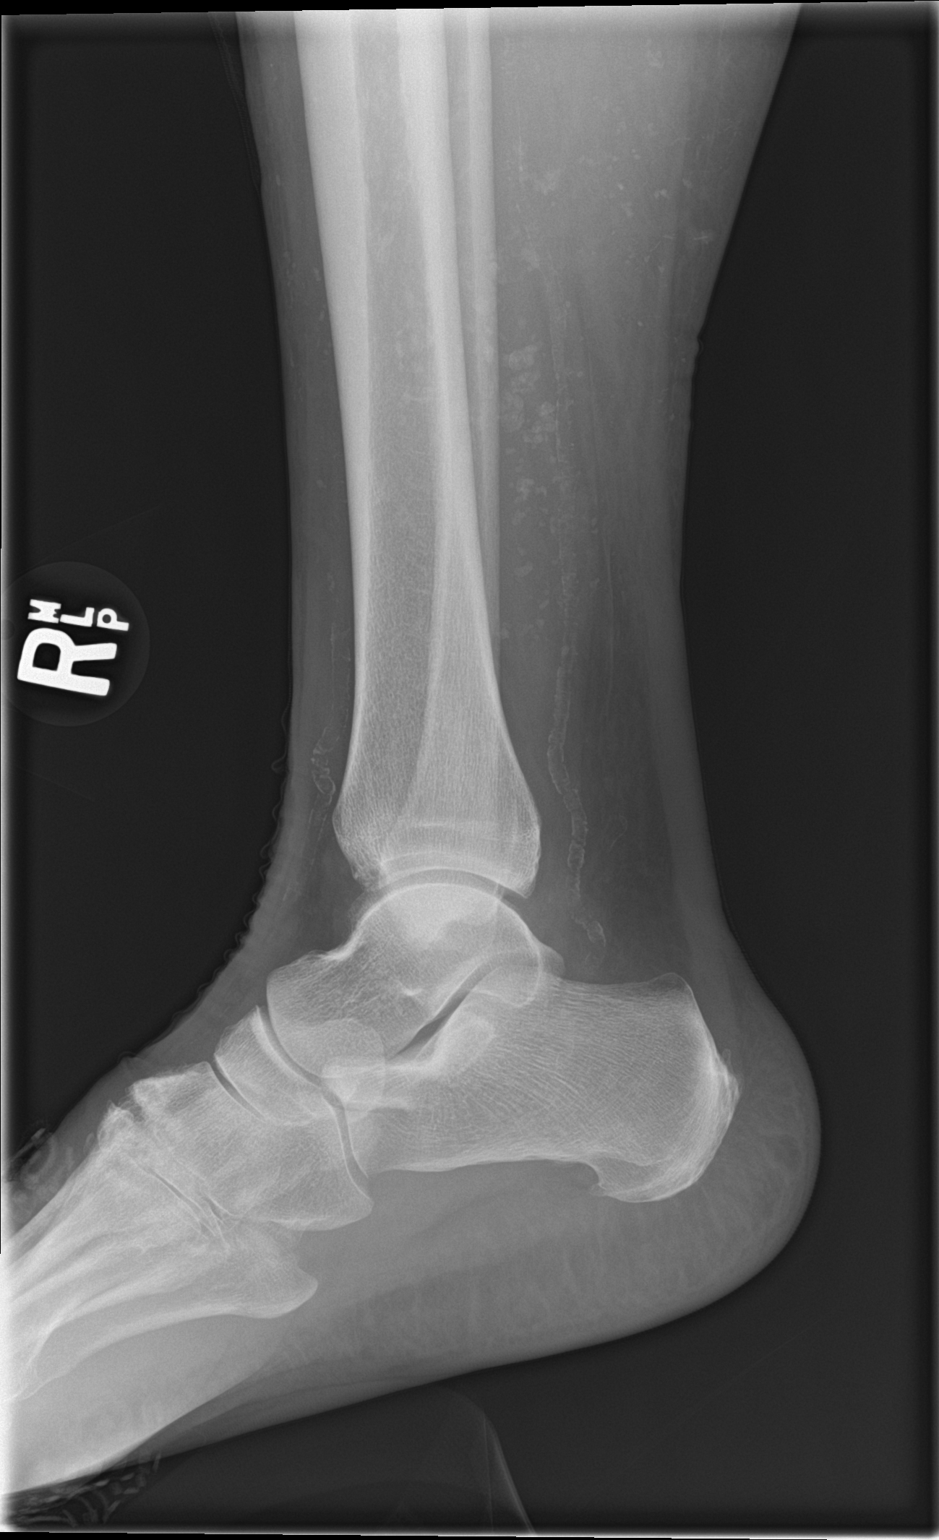

[ankle obl]
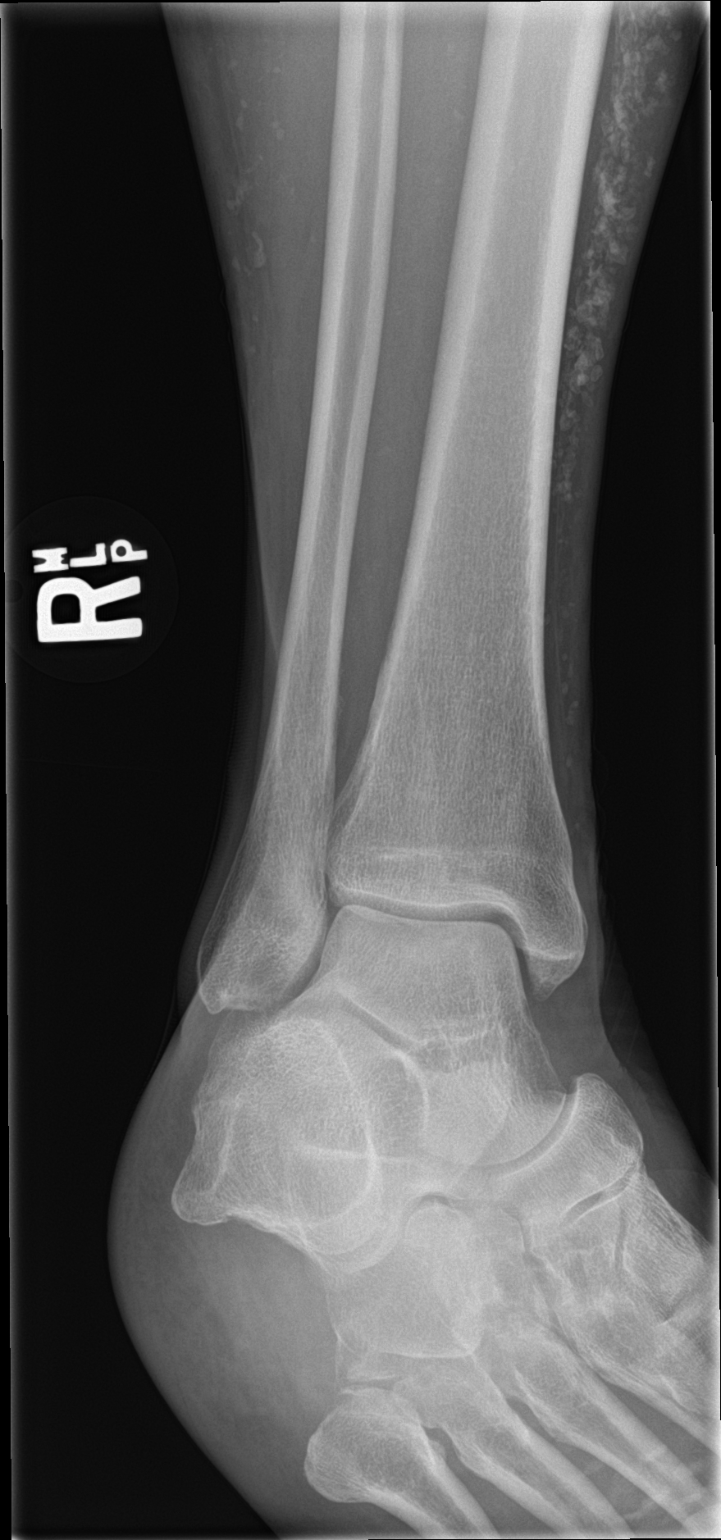

[3 of 3 positions shown; findings below may reference images not displayed]

FINDINGS: There is no evidence of fracture, dislocation, or joint effusion.
Posterior and plantar calcaneal heel spurs. Extensive venous
calcifications noted. Soft tissues are unremarkable.
IMPRESSION: 1. No acute bone abnormality.
2. Heel spurs.

## 2024-03-04 DEATH — deceased
